# Patient Record
Sex: Female | Born: 1940
Health system: Southern US, Community
[De-identification: ages and names within clinical notes are randomized; demographics above are authoritative.]

## PROBLEM LIST (undated history)

## (undated) DIAGNOSIS — H3530 Unspecified macular degeneration: Secondary | ICD-10-CM

## (undated) DIAGNOSIS — D219 Benign neoplasm of connective and other soft tissue, unspecified: Secondary | ICD-10-CM

## (undated) DIAGNOSIS — H269 Unspecified cataract: Secondary | ICD-10-CM

## (undated) DIAGNOSIS — R569 Unspecified convulsions: Secondary | ICD-10-CM

## (undated) DIAGNOSIS — IMO0001 Reserved for inherently not codable concepts without codable children: Secondary | ICD-10-CM

## (undated) HISTORY — PX: BACK SURGERY: SHX140

## (undated) HISTORY — PX: INCONTINENCE SURGERY: SHX676

## (undated) HISTORY — PX: ABDOMINAL HYSTERECTOMY: SHX81

## (undated) HISTORY — DX: Benign neoplasm of connective and other soft tissue, unspecified: D21.9

## (undated) HISTORY — PX: APPENDECTOMY: SHX54

## (undated) HISTORY — PX: EYE SURGERY: SHX253

## (undated) HISTORY — DX: Unspecified cataract: H26.9

## (undated) HISTORY — DX: Reserved for inherently not codable concepts without codable children: IMO0001

## (undated) HISTORY — DX: Unspecified convulsions: R56.9

## (undated) HISTORY — PX: OTHER SURGICAL HISTORY: SHX169

---

## 1999-01-07 ENCOUNTER — Encounter: Payer: Self-pay | Admitting: Neurosurgery

## 1999-01-07 ENCOUNTER — Observation Stay (HOSPITAL_COMMUNITY): Admission: RE | Admit: 1999-01-07 | Discharge: 1999-01-07 | Payer: Self-pay | Admitting: Neurosurgery

## 2004-10-10 ENCOUNTER — Ambulatory Visit: Payer: Self-pay | Admitting: Unknown Physician Specialty

## 2004-10-28 ENCOUNTER — Ambulatory Visit: Payer: Self-pay | Admitting: Unknown Physician Specialty

## 2005-01-05 ENCOUNTER — Ambulatory Visit: Payer: Self-pay | Admitting: Gastroenterology

## 2007-11-01 ENCOUNTER — Encounter: Admission: RE | Admit: 2007-11-01 | Discharge: 2007-11-01 | Payer: Self-pay | Admitting: Family Medicine

## 2007-11-01 ENCOUNTER — Ambulatory Visit: Payer: Self-pay | Admitting: Family Medicine

## 2007-11-01 DIAGNOSIS — N318 Other neuromuscular dysfunction of bladder: Secondary | ICD-10-CM

## 2007-11-02 ENCOUNTER — Encounter: Payer: Self-pay | Admitting: Family Medicine

## 2007-11-02 DIAGNOSIS — M810 Age-related osteoporosis without current pathological fracture: Secondary | ICD-10-CM

## 2007-11-15 ENCOUNTER — Encounter: Payer: Self-pay | Admitting: Family Medicine

## 2007-11-23 ENCOUNTER — Ambulatory Visit: Payer: Self-pay | Admitting: Family Medicine

## 2007-11-23 ENCOUNTER — Encounter: Payer: Self-pay | Admitting: Family Medicine

## 2007-11-23 ENCOUNTER — Other Ambulatory Visit: Admission: RE | Admit: 2007-11-23 | Discharge: 2007-11-23 | Payer: Self-pay | Admitting: Family Medicine

## 2007-11-23 DIAGNOSIS — N814 Uterovaginal prolapse, unspecified: Secondary | ICD-10-CM | POA: Insufficient documentation

## 2007-11-24 ENCOUNTER — Encounter: Payer: Self-pay | Admitting: Family Medicine

## 2007-11-24 LAB — CONVERTED CEMR LAB: Vit D, 1,25-Dihydroxy: 16 — ABNORMAL LOW (ref 30–89)

## 2007-11-25 ENCOUNTER — Encounter: Payer: Self-pay | Admitting: Family Medicine

## 2007-11-25 LAB — CONVERTED CEMR LAB: Pap Smear: NORMAL

## 2007-12-26 ENCOUNTER — Encounter: Payer: Self-pay | Admitting: Family Medicine

## 2008-02-14 ENCOUNTER — Encounter: Admission: RE | Admit: 2008-02-14 | Discharge: 2008-02-14 | Payer: Self-pay | Admitting: Family Medicine

## 2008-02-14 ENCOUNTER — Ambulatory Visit: Payer: Self-pay | Admitting: Family Medicine

## 2008-02-14 DIAGNOSIS — IMO0002 Reserved for concepts with insufficient information to code with codable children: Secondary | ICD-10-CM

## 2008-02-16 ENCOUNTER — Encounter: Payer: Self-pay | Admitting: Family Medicine

## 2008-02-23 ENCOUNTER — Encounter: Payer: Self-pay | Admitting: Family Medicine

## 2008-02-24 ENCOUNTER — Encounter: Payer: Self-pay | Admitting: Family Medicine

## 2008-03-13 ENCOUNTER — Encounter: Payer: Self-pay | Admitting: Family Medicine

## 2008-04-17 ENCOUNTER — Encounter: Payer: Self-pay | Admitting: Family Medicine

## 2009-05-11 ENCOUNTER — Ambulatory Visit: Payer: Self-pay | Admitting: Family Medicine

## 2009-05-11 DIAGNOSIS — M5430 Sciatica, unspecified side: Secondary | ICD-10-CM

## 2009-05-22 ENCOUNTER — Ambulatory Visit: Payer: Self-pay | Admitting: Family Medicine

## 2009-06-03 ENCOUNTER — Telehealth: Payer: Self-pay | Admitting: Family Medicine

## 2009-06-10 ENCOUNTER — Encounter: Admission: RE | Admit: 2009-06-10 | Discharge: 2009-07-12 | Payer: Self-pay | Admitting: Family Medicine

## 2009-06-12 ENCOUNTER — Encounter: Payer: Self-pay | Admitting: Family Medicine

## 2009-06-26 ENCOUNTER — Encounter: Payer: Self-pay | Admitting: Family Medicine

## 2009-07-22 IMAGING — MG MM DIGITAL SCREENING
5 series · 5 of 5 positions shown · non-contrast
Comparison: none

DG SCREEN MAMMOGRAM BILATERAL
Bilateral CC and MLO view(s) were taken.
Technologist: Odai Hosein

DIGITAL SCREENING MAMMOGRAM WITH CAD:
There are scattered fibroglandular densities.  No masses or malignant type calcifications are 
identified.

[R CC]
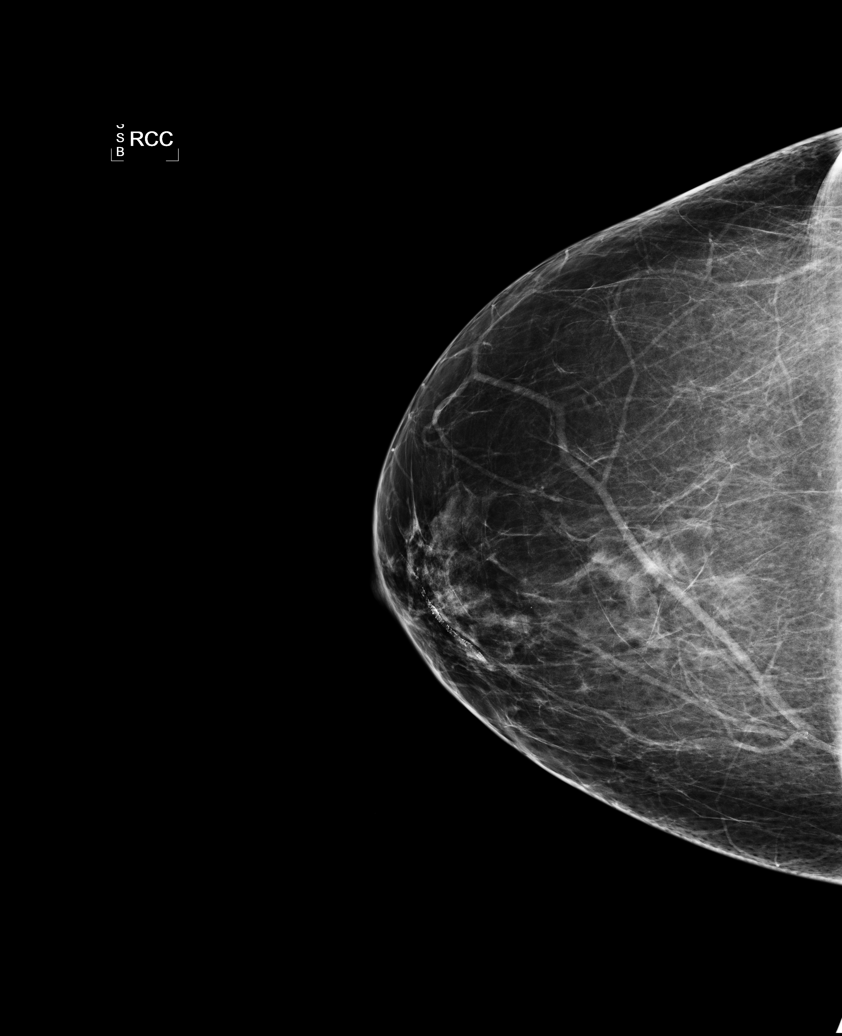

[L CC]
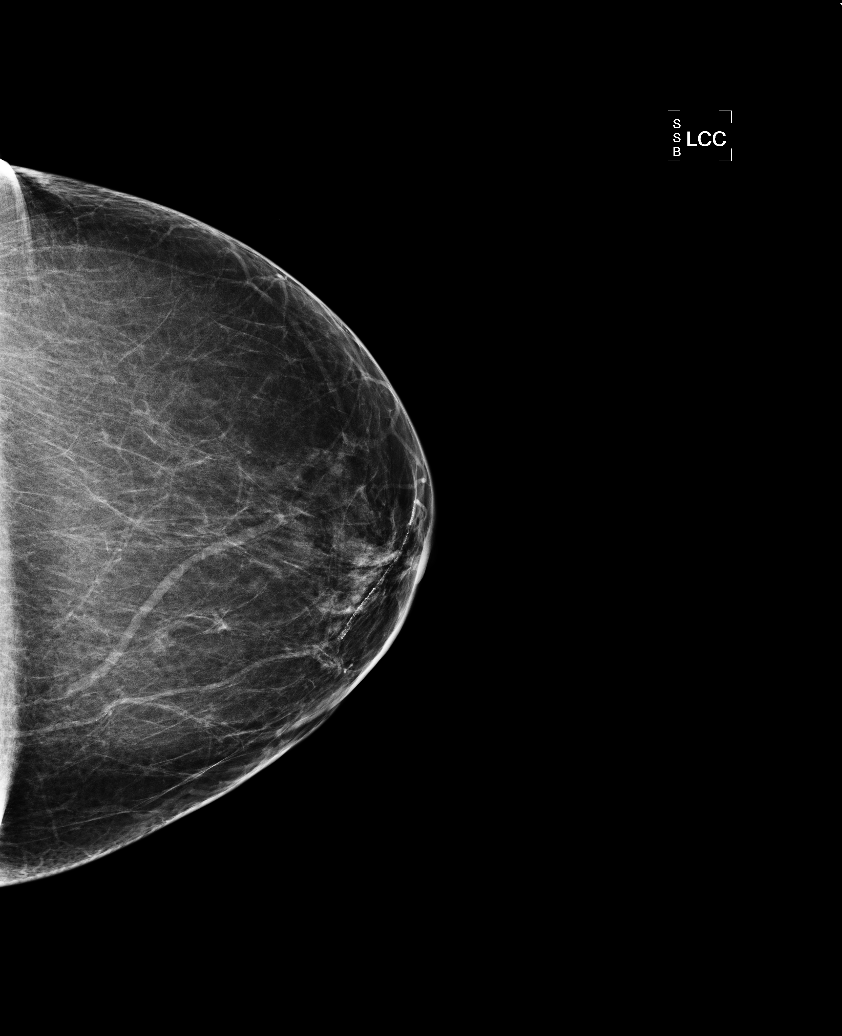

[L MLO]
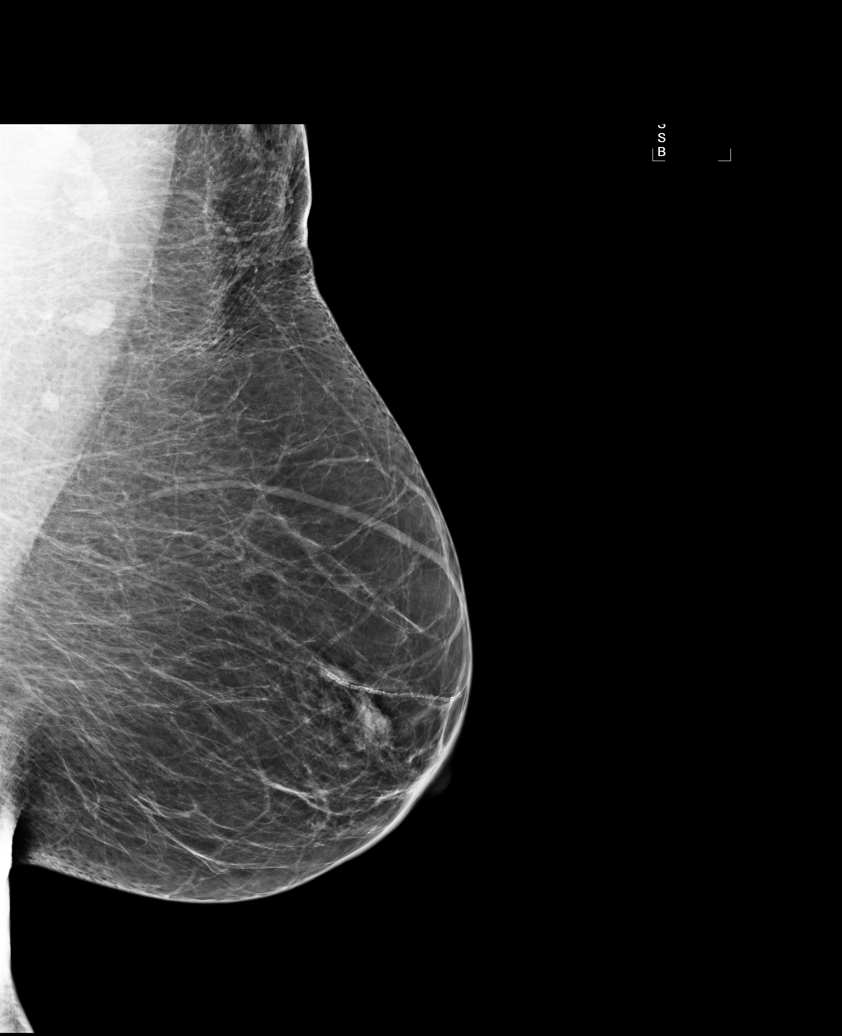

[R MLO (1 of 2)]
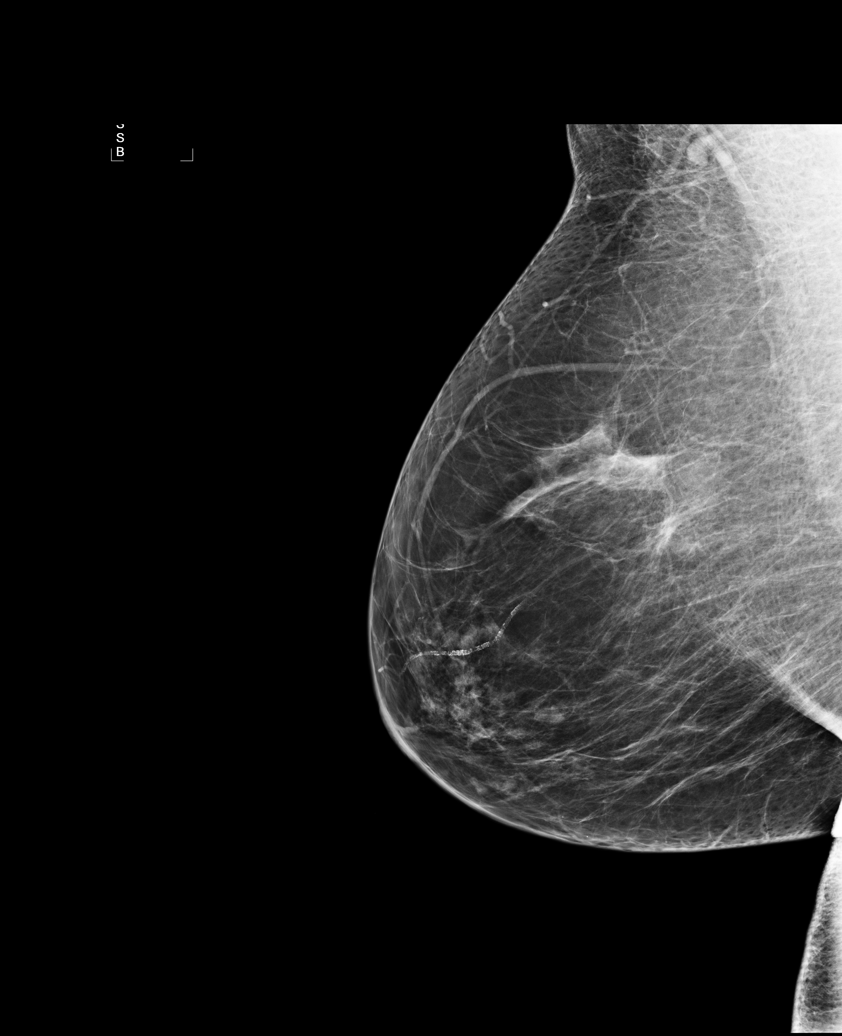

[R MLO (2 of 2)]
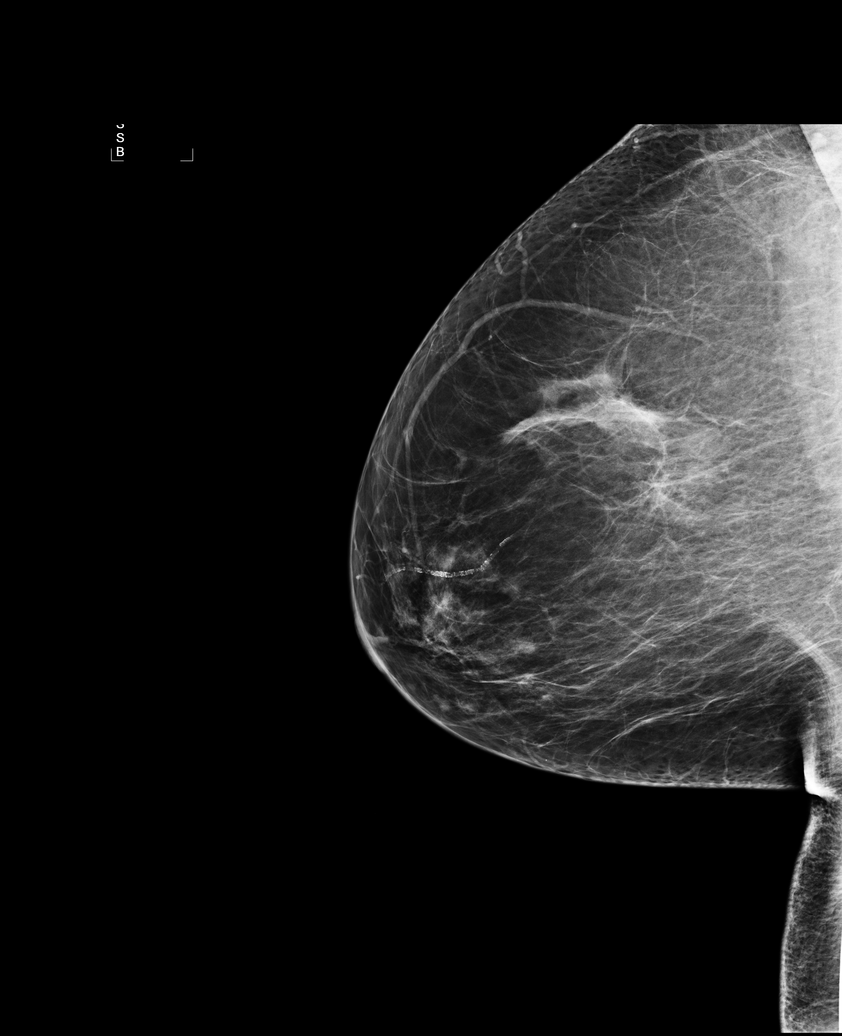

[5 of 5 positions shown; findings below may reference images not displayed]

IMPRESSION: No specific mammographic evidence of malignancy.  Next screening mammogram is recommended in one 
year.

ASSESSMENT: Negative - BI-RADS 1

Screening mammogram in 1 year.
THIS WAS ANALAYZED BY COMPUTER AIDED DETECTION. , THIS PROCEDURE WAS A DIGITAL MAMMOGRAM.

## 2010-07-23 ENCOUNTER — Ambulatory Visit: Payer: Self-pay | Admitting: Family Medicine

## 2010-07-23 ENCOUNTER — Encounter (INDEPENDENT_AMBULATORY_CARE_PROVIDER_SITE_OTHER): Payer: Self-pay | Admitting: *Deleted

## 2010-07-23 DIAGNOSIS — E559 Vitamin D deficiency, unspecified: Secondary | ICD-10-CM | POA: Insufficient documentation

## 2010-07-31 ENCOUNTER — Encounter: Payer: Self-pay | Admitting: Family Medicine

## 2010-08-01 DIAGNOSIS — E785 Hyperlipidemia, unspecified: Secondary | ICD-10-CM

## 2010-08-01 LAB — CONVERTED CEMR LAB
Albumin: 4.7 g/dL (ref 3.5–5.2)
BUN: 20 mg/dL (ref 6–23)
Calcium: 9.8 mg/dL (ref 8.4–10.5)
Chloride: 105 meq/L (ref 96–112)
Cholesterol: 237 mg/dL — ABNORMAL HIGH (ref 0–200)
Glucose, Bld: 96 mg/dL (ref 70–99)
HDL goal, serum: 40 mg/dL
HDL: 51 mg/dL (ref >39)
Hemoglobin: 14.9 g/dL (ref 12.0–15.0)
MCHC: 33.1 g/dL (ref 30.0–36.0)
Potassium: 5 meq/L (ref 3.5–5.3)
RBC: 4.63 M/uL (ref 3.87–5.11)
Triglycerides: 139 mg/dL (ref <150)

## 2010-08-13 ENCOUNTER — Encounter: Admission: RE | Admit: 2010-08-13 | Discharge: 2010-08-13 | Payer: Self-pay | Admitting: Family Medicine

## 2010-11-16 LAB — CONVERTED CEMR LAB
Albumin: 4.8 g/dL (ref 3.5–5.2)
Alkaline Phosphatase: 74 units/L (ref 39–117)
Chloride: 103 meq/L (ref 96–112)
Glucose, Bld: 86 mg/dL (ref 70–99)
LDL Cholesterol: 158 mg/dL — ABNORMAL HIGH (ref 0–99)
Potassium: 4.3 meq/L (ref 3.5–5.3)
Sodium: 142 meq/L (ref 135–145)
Total Protein: 7.8 g/dL (ref 6.0–8.3)
Triglycerides: 155 mg/dL — ABNORMAL HIGH (ref <150)

## 2010-11-18 NOTE — Assessment & Plan Note (Signed)
Summary: Medicare Annual Wellness Exam   Vital Signs:  Patient profile:   70 year old female Menstrual status:  postmenopausal Height:      63 inches Weight:      156 pounds BMI:     27.73 O2 Sat:      98 % on Room air Pulse rate:   85 / minute BP sitting:   134 / 84  (right arm)  Vitals Entered By: Payton Spark CMA (July 23, 2010 11:06 AM)  O2 Flow:  Room air CC: Medicare Wellness Exam     Menstrual Status postmenopausal Last PAP Result Normal   Primary Care Provider:  Seymour Bars DO  CC:  Medicare Wellness Exam.  History of Present Illness: see scanned in form.  Current Medications (verified): 1)  Centrum Silver   Tabs (Multiple Vitamins-Minerals) .... Take 1 Tablet By Mouth Once A Day 2)  Adult Aspirin Low Strength 81 Mg  Tbdp (Aspirin) .... Take 1 Tablet By Mouth Once A Day 3)  Citracal + D 250-200 Mg-Unit  Tabs (Calcium Citrate-Vitamin D) .... Take 1 Tablet By Mouth Two Times A Day 4)  Lutein 20 Mg Caps (Lutein)  Allergies (verified): No Known Drug Allergies  Past History:  Past Medical History: Reviewed history from 02/16/2008 and no changes required. uterine fibroids on u/s 2006 2D echo/ stress test "normal" 2006 CXR normal 2006 osteopenia ETT 1-06  normal colonoscopy 3-06; Dr Henrene Hawking  Past Surgical History: Reviewed history from 05/22/2009 and no changes required. Appendectomy diskectomy Pinnacle bladder sling/ trans obturator sling/ cystourethroscopy/ sacrospinalis ligament fixation of vaginal vault 02-2009  by Dr Charlsie Merles [facility]) hysterectomy  Family History: Reviewed history from 11/01/2007 and no changes required. brother alive, prostate cancer sister fibromyalgia 2 other sibblings healthy mother died pancreatic cancer father died 'old age'  Social History: Reviewed history from 11/01/2007 and no changes required. Retired Lawyer. Married to Victoria Vera.  Has 2 grown children.  Daughter and grandaughter in [place], son in [place]. Never  smoked. Denies ETOH. Walks 30 min a day. Good diet.  Review of Systems  The patient denies anorexia, fever, weight loss, weight gain, vision loss, decreased hearing, hoarseness, chest pain, syncope, dyspnea on exertion, peripheral edema, prolonged cough, headaches, hemoptysis, abdominal pain, melena, hematochezia, severe indigestion/heartburn, hematuria, incontinence, genital sores, muscle weakness, suspicious skin lesions, transient blindness, difficulty walking, depression, unusual weight change, abnormal bleeding, enlarged lymph nodes, angioedema, breast masses, and testicular masses.    Physical Exam  General:  alert, well-developed, well-nourished, well-hydrated, and overweight-appearing.   Head:  normocephalic and atraumatic.   Eyes:  pupils equal, pupils round, and pupils reactive to light.  wears glasses Ears:  EACs patent; TMs translucent and gray with good cone of light and bony landmarks.  Nose:  no nasal discharge.   Mouth:  pharynx pink and moist.  dentures in Neck:  no masses.  no audible carotid bruits Lungs:  normal respiratory effort, no intercostal retractions, no accessory muscle use, and normal breath sounds.   Heart:  normal rate, regular rhythm, and no murmur.   Abdomen:  Bowel sounds positive,abdomen soft and non-tender without masses, organomegaly or hernias noted. Msk:  synovitis in the DIP finger joints Pulses:  2+ radial and pedal pulses Extremities:  no E/C/C Skin:  color normal.   Psych:  good eye contact, not anxious appearing, and not depressed appearing.     Impression & Recommendations:  Problem # 1:  HEALTH SCREENING (ICD-V70.0) Schedule of personalized health plan reviewed and copy  given to pt today.   Orders given for fasting labs, mammogram and DEXA. Pneumovax done 09.  Declined flu shot. Pap due in 4 mos, will schedule.  Has eye exam tomorrow.   Colonoscopy due 2016. EKG done today showing NSR at 68 bpm, PR 154 ms, QTC 423 ms, no ST seg  changes or TW changes. Next Medicare AWV in 1 yr. Orders: [facility] -Subsequent Annual Wellness Visit [phone]) EKG w/ Interpretation (93000)  Complete Medication List: 1)  Centrum Silver Tabs (Multiple vitamins-minerals) .... Take 1 tablet by mouth once a day 2)  Adult Aspirin Low Strength 81 Mg Tbdp (Aspirin) .... Take 1 tablet by mouth once a day 3)  Citracal + D 250-200 Mg-unit Tabs (Calcium citrate-vitamin d) .... Take 1 tablet by mouth two times a day 4)  Lutein 20 Mg Caps (Lutein) 5)  Zostavax  .... Administer x 1 age >6  Other Orders: T-CBC No Diff ([id number]) T-Comprehensive Metabolic Panel [phone]) T-Lipid Profile [id number]) T-Vitamin D (25-Hydroxy) [id number]) T-Mammography Bilateral Screening (18299) T-DXA Bone Density/ Appendicular (37169) T-Dual DXA Bone Density/ Axial (67893)  Patient Instructions: 1)  Update labs, mammogram and DEXA. 2)  Order given for Zostavax.  Can bring to [facility]. 3)  Will call you w/ labs results tomorrow. 4)  Schedule PAP smear in 4 months. Prescriptions: ZOSTAVAX Administer x 1 age >10  #1 x 0   Entered and Authorized by:   Seymour Bars DO   Signed by:   Seymour Bars DO on 07/23/2010   Method used:   Print then Give to Patient   RxID:   [id number]

## 2010-11-18 NOTE — Miscellaneous (Signed)
Summary: Immunization Entry   Immunization History:  Zostavax History:    Zostavax # 1:  zostavax (07/23/2010)

## 2010-11-25 ENCOUNTER — Encounter: Payer: Self-pay | Admitting: Family Medicine

## 2010-11-25 ENCOUNTER — Other Ambulatory Visit: Payer: Self-pay | Admitting: Family Medicine

## 2010-11-25 ENCOUNTER — Ambulatory Visit (INDEPENDENT_AMBULATORY_CARE_PROVIDER_SITE_OTHER): Payer: MEDICARE | Admitting: Family Medicine

## 2010-11-25 ENCOUNTER — Other Ambulatory Visit (HOSPITAL_COMMUNITY)
Admission: RE | Admit: 2010-11-25 | Discharge: 2010-11-25 | Disposition: A | Discharge status: Home / Self Care | Payer: MEDICARE | Source: Ambulatory Visit | Attending: Family Medicine | Admitting: Family Medicine

## 2010-11-25 DIAGNOSIS — Z01419 Encounter for gynecological examination (general) (routine) without abnormal findings: Secondary | ICD-10-CM | POA: Insufficient documentation

## 2010-11-25 DIAGNOSIS — Z124 Encounter for screening for malignant neoplasm of cervix: Secondary | ICD-10-CM

## 2010-11-26 ENCOUNTER — Encounter: Payer: Self-pay | Admitting: Family Medicine

## 2010-12-04 NOTE — Assessment & Plan Note (Signed)
Summary: PAP   Vital Signs:  Patient profile:   70 year old female Menstrual status:  postmenopausal Height:      63 inches Weight:      157 pounds Pulse rate:   88 / minute BP sitting:   147 / 93  (right arm) Cuff size:   regular  Vitals Entered By: Avon Gully CMA, Duncan Dull) (November 25, 2010 8:34 AM) CC: PAP   Primary Care Provider:  Seymour Bars DO  CC:  PAP.  History of Present Illness: 70 yo WF presents for PAP only.  She has not had a pap in 3 yrs.  Married, monogamous.  Postmenopausal with no HRT use.  Denies vag bleeding or discharge but has some atrophy which causes painful intercourse even w/ lubricants.  No hx of abnormal pap.  O/W doing great.  BP at home today was 116/82.    Per records, she did have a hysterectomy.    Current Medications (verified): 1)  Centrum Silver   Tabs (Multiple Vitamins-Minerals) .... Take 1 Tablet By Mouth Once A Day 2)  Adult Aspirin Low Strength 81 Mg  Tbdp (Aspirin) .... Take 1 Tablet By Mouth Once A Day 3)  Citracal + D 250-200 Mg-Unit  Tabs (Calcium Citrate-Vitamin D) .... Take 1 Tablet By Mouth Two Times A Day 4)  Lutein 20 Mg Caps (Lutein) 5)  Zostavax .... Administer X 1 Age >60  Allergies (verified): No Known Drug Allergies  Comments:  Nurse/Medical Assistant: The patient's medications and allergies were reviewed with the patient and were updated in the Medication and Allergy Lists. Avon Gully CMA, Duncan Dull) (November 25, 2010 8:35 AM)  Past History:  Past Medical History: Reviewed history from 02/16/2008 and no changes required. uterine fibroids on u/s 2006 2D echo/ stress test "normal" 2006 CXR normal 2006 osteopenia ETT 1-06  normal colonoscopy 3-06; Dr Henrene Hawking  Past Surgical History: Reviewed history from 05/22/2009 and no changes required. Appendectomy diskectomy Pinnacle bladder sling/ trans obturator sling/ cystourethroscopy/ sacrospinalis ligament fixation of vaginal vault 02-2009  by Dr Charlsie Merles [facility]) hysterectomy  Social History: Reviewed history from 11/01/2007 and no changes required. 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  Daughter and grandaughter in [place] at [place], son in [place]. Never smoked. Denies ETOH. Walks 30 min a day. Good diet.  Review of Systems      See HPI  Physical Exam  General:  alert, well-developed, well-nourished, and well-hydrated.   Genitalia:  ext atrophy, normal vaginal mucosa.  vag cuff swabbed for thin prep pap   Impression & Recommendations:  Problem # 1:  SCREENING FOR MALIGNANT NEOPLASM OF THE CERVIX (ICD-V76.2) Pap done.  Low risk.  Given age and monogamous status, will not need any more paps after this unless having bleeding or discharged.  F/U results. RX for Estrace PV cream given to use 1 x a wk for atrophic vaginitis.  Complete Medication List: 1)  Centrum Silver Tabs (Multiple vitamins-minerals) .... Take 1 tablet by mouth once a day 2)  Adult Aspirin Low Strength 81 Mg Tbdp (Aspirin) .... Take 1 tablet by mouth once a day 3)  Citracal + D 250-200 Mg-unit Tabs (Calcium citrate-vitamin d) .... Take 1 tablet by mouth two times a day 4)  Lutein 20 Mg Caps (Lutein) 5)  Zostavax  .... Administer x 1 age >79 6)  Estrace 0.1 Mg/gm Crea (Estradiol) .Marland Kitchen.. 1 gram pv 1 x a wk  Patient Instructions: 1)  Will call you w/ pap  results in the next wk. 2)  Trial of Estrace vaginal cream 1 x a wk. 3)  Return for f/u in 6 mos. Prescriptions: ESTRACE 0.1 MG/GM CREA (ESTRADIOL) 1 gram PV 1 x a wk  #42.5 g x 2   Entered and Authorized by:   Seymour Bars DO   Signed by:   Seymour Bars DO on 11/25/2010   Method used:   Electronically to        [facility] [phone]* (retail)       [address]       [place], [place]  [address]       Ph: [phone]       Fax: [phone]   RxID:   [id number]    Orders Added: 1)  Est. Patient Level III [64403]

## 2011-04-12 ENCOUNTER — Encounter: Payer: Self-pay | Admitting: Emergency Medicine

## 2011-04-12 ENCOUNTER — Inpatient Hospital Stay (INDEPENDENT_AMBULATORY_CARE_PROVIDER_SITE_OTHER)
Admission: RE | Admit: 2011-04-12 | Discharge: 2011-04-12 | Disposition: A | Discharge status: Home / Self Care | Payer: Medicare Other | Source: Ambulatory Visit | Attending: Emergency Medicine | Admitting: Emergency Medicine

## 2011-04-12 DIAGNOSIS — N390 Urinary tract infection, site not specified: Secondary | ICD-10-CM

## 2011-04-15 ENCOUNTER — Telehealth (INDEPENDENT_AMBULATORY_CARE_PROVIDER_SITE_OTHER): Payer: Self-pay | Admitting: *Deleted

## 2011-06-02 ENCOUNTER — Encounter: Payer: Self-pay | Admitting: Family Medicine

## 2011-06-02 ENCOUNTER — Inpatient Hospital Stay (INDEPENDENT_AMBULATORY_CARE_PROVIDER_SITE_OTHER)
Admission: RE | Admit: 2011-06-02 | Discharge: 2011-06-02 | Disposition: A | Discharge status: Home / Self Care | Payer: Medicare Other | Source: Ambulatory Visit | Attending: Family Medicine | Admitting: Family Medicine

## 2011-06-02 DIAGNOSIS — N390 Urinary tract infection, site not specified: Secondary | ICD-10-CM

## 2011-06-02 LAB — CONVERTED CEMR LAB
Ketones, urine, test strip: NEGATIVE
Nitrite: POSITIVE
Urobilinogen, UA: 0.2

## 2011-06-06 ENCOUNTER — Telehealth (INDEPENDENT_AMBULATORY_CARE_PROVIDER_SITE_OTHER): Payer: Self-pay | Admitting: Emergency Medicine

## 2011-09-21 NOTE — Progress Notes (Signed)
Summary: Possible UTI? rm 4   Vital Signs:  Patient Profile:   70 Years Old Female CC:      ? UTI x 2 days Height:     63 inches Weight:      155 pounds O2 Sat:      98 % O2 treatment:    Room Air Temp:     98.5 degrees F oral Pulse rate:   76 / minute Resp:     18 per minute BP sitting:   122 / 72  (left arm) Cuff size:   regular  Vitals Entered By: Clemens Catholic LPN (June 02, 2011 10:22 AM)                  Prior Medication List:  CENTRUM SILVER   TABS (MULTIPLE VITAMINS-MINERALS) Take 1 tablet by mouth once a day ADULT ASPIRIN LOW STRENGTH 81 MG  TBDP (ASPIRIN) Take 1 tablet by mouth once a day CITRACAL + D 250-200 MG-UNIT  TABS (CALCIUM CITRATE-VITAMIN D) Take 1 tablet by mouth two times a day LUTEIN 20 MG CAPS (LUTEIN)  * ZOSTAVAX Administer x 1 age >57 ESTRACE 0.1 MG/GM CREA (ESTRADIOL) 1 gram PV 1 x a wk CIPROFLOXACIN HCL 250 MG TABS (CIPROFLOXACIN HCL) 1 by mouth two times a day for 7 days   Updated Prior Medication List: CENTRUM SILVER   TABS (MULTIPLE VITAMINS-MINERALS) Take 1 tablet by mouth once a day ADULT ASPIRIN LOW STRENGTH 81 MG  TBDP (ASPIRIN) Take 1 tablet by mouth once a day ESTRACE 0.1 MG/GM CREA (ESTRADIOL) 1 gram PV 1 x a wk  Current Allergies (reviewed today): No known allergies History of Present Illness Chief Complaint: ? UTI x 2 days History of Present Illness: Patient reports started having symptoms two days aago. This is her 2nd Uti in the last 6 months. She had back pain and lower abdominal pain   Current Problems: URINARY TRACT INFECTION (ICD-599.0) SCREENING FOR MALIGNANT NEOPLASM OF THE CERVIX (ICD-V76.2) HYPERLIPIDEMIA (ICD-272.4) HEALTH SCREENING (ICD-V70.0) OTHER SCREENING MAMMOGRAM (ICD-V76.12) OTH&UNSPEC ENDOCRN NUTRIT METAB&IMMUNITY D/O (ICD-V77.99) SCREENING FOR LIPOID DISORDERS (ICD-V77.91) SCREENING, IRON DEFICIENCY ANEMIA (ICD-V78.0) UNSPECIFIED VITAMIN D DEFICIENCY (ICD-268.9) SCIATICA (ICD-724.3) BACK PAIN,  LUMBAR, WITH RADICULOPATHY (ICD-724.4) UTERINE PROLAPSE (ICD-618.1) OSTEOPENIA (ICD-733.90) OVERACTIVE BLADDER (ICD-596.51)   Current Meds CENTRUM SILVER   TABS (MULTIPLE VITAMINS-MINERALS) Take 1 tablet by mouth once a day ADULT ASPIRIN LOW STRENGTH 81 MG  TBDP (ASPIRIN) Take 1 tablet by mouth once a day ESTRACE 0.1 MG/GM CREA (ESTRADIOL) 1 gram PV 1 x a wk PYRIDIUM 200 MG TABS (PHENAZOPYRIDINE HCL) 1 by mouth 3 x aday KEFLEX 500 MG CAPS (CEPHALEXIN) 1 by mouth twice a day  REVIEW OF SYSTEMS Constitutional Symptoms      Denies fever, chills, night sweats, weight loss, weight gain, and fatigue.  Eyes       Denies change in vision, eye pain, eye discharge, glasses, contact lenses, and eye surgery. Ear/Nose/Throat/Mouth       Denies hearing loss/aids, change in hearing, ear pain, ear discharge, dizziness, frequent runny nose, frequent nose bleeds, sinus problems, sore throat, hoarseness, and tooth pain or bleeding.  Respiratory       Denies dry cough, productive cough, wheezing, shortness of breath, asthma, bronchitis, and emphysema/COPD.  Cardiovascular       Denies murmurs, chest pain, and tires easily with exhertion.    Gastrointestinal       Denies stomach pain, nausea/vomiting, diarrhea, constipation, blood in bowel movements, and indigestion. Genitourniary  Complains of painful urination.      Denies kidney stones and loss of urinary control. Neurological       Denies paralysis, seizures, and fainting/blackouts. Musculoskeletal       Denies muscle pain, joint pain, joint stiffness, decreased range of motion, redness, swelling, muscle weakness, and gout.  Skin       Denies bruising, unusual mles/lumps or sores, and hair/skin or nail changes.  Psych       Denies mood changes, temper/anger issues, anxiety/stress, speech problems, depression, and sleep problems. Other Comments: pt c/o low back pain and burning with urination x 2 days. she took 2 AZO today.   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  Daughter and grandaughter in [place], son in [place]. Never smoked. Denies ETOH. Walks 30 min a day. Good diet.  Risk Factors: Caffeine Use: 2 (11/01/2007)  Risk Factors: Smoking Status: never (11/01/2007)  Past Medical History: Reviewed history from 02/16/2008 and no changes required. uterine fibroids on u/s 2006 2D echo/ stress test "normal" 2006 CXR normal 2006 osteopenia ETT 1-06  normal colonoscopy 3-06; Dr Henrene Hawking  Past Surgical History: Reviewed history from 05/22/2009 and no changes required. Appendectomy diskectomy Pinnacle bladder sling/ trans obturator sling/ cystourethroscopy/ sacrospinalis ligament fixation of vaginal vault 02-2009  by Dr Charlsie Merles [facility]) hysterectomy  Family History: Reviewed history from 11/01/2007 and no changes required. brother alive, prostate cancer sister fibromyalgia 2 other sibblings healthy mother died pancreatic cancer father died 'old age'  Social History: Reviewed history from 11/01/2007 and no changes required. 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  Daughter and grandaughter in [place], son in [place]. Never smoked. Denies ETOH. Walks 30 min a day. Good diet. Physical Exam General appearance: well developed, well nourished, no acute distress Head: normocephalic, atraumatic Neurological: grossly intact and non-focal Back: no CVA tenderness Skin: no obvious rashes or lesions MSE: oriented to time, place, and person Assessment Problems:   SCREENING FOR MALIGNANT NEOPLASM OF THE CERVIX (ICD-V76.2) HYPERLIPIDEMIA (ICD-272.4) HEALTH SCREENING (ICD-V70.0) OTHER SCREENING MAMMOGRAM (ICD-V76.12) OTH&UNSPEC ENDOCRN NUTRIT  METAB&IMMUNITY D/O (ICD-V77.99) SCREENING FOR LIPOID DISORDERS (ICD-V77.91) SCREENING, IRON DEFICIENCY ANEMIA (ICD-V78.0) UNSPECIFIED VITAMIN D DEFICIENCY (ICD-268.9) SCIATICA (ICD-724.3) BACK PAIN, LUMBAR, WITH RADICULOPATHY (ICD-724.4) UTERINE PROLAPSE (ICD-618.1) OSTEOPENIA (ICD-733.90) OVERACTIVE BLADDER (ICD-596.51) New Problems: URINARY TRACT INFECTION (ICD-599.0)   Patient Education: Patient and/or caregiver instructed in the following: rest fluids and Tylenol.  Plan New Medications/Changes: KEFLEX 500 MG CAPS (CEPHALEXIN) 1 by mouth twice a day  #20 x 0, 06/02/2011, Hassan Rowan MD PYRIDIUM 200 MG TABS (PHENAZOPYRIDINE HCL) 1 by mouth 3 x aday  #15 x 0, 06/02/2011, Hassan Rowan MD  New Orders: Est. Patient Level III [phone] Urinalysis-dipstick only (Medicare patient) [60454UJ] T-Culture, Urine [[id number]] Follow Up: Follow up in 2-3 days if no improvement, Follow up on an as needed basis, Follow up with Primary Physician  The patient and/or caregiver has been counseled thoroughly with regard to medications prescribed including dosage, schedule, interactions, rationale for use, and possible side effects and they verbalize understanding.  Diagnoses and expected course of recovery discussed and will return if not improved as expected or if the condition worsens. Patient and/or caregiver verbalized understanding.  Prescriptions: KEFLEX 500 MG CAPS (CEPHALEXIN) 1 by mouth twice a day  #20 x 0  Entered and Authorized by:   Hassan Rowan MD   Signed by:   Hassan Rowan MD on 06/02/2011   Method used:   Printed then faxed to ...       [facility] [phone]* (retail)       [address]       [place], [place]  [address]       Ph: [phone]       Fax: [phone]   RxID:   [id number] PYRIDIUM 200 MG TABS (PHENAZOPYRIDINE HCL) 1 by mouth 3 x aday  #15 x 0   Entered and Authorized by:   Hassan Rowan MD   Signed by:   Hassan Rowan MD on 06/02/2011   Method used:   Electronically  to        [facility] [phone]* (retail)       [address]       [place], [place]  [address]       Ph: [phone]       Fax: [phone]   RxID:   [id number]   Patient Instructions: 1)  Please schedule a follow-up appointment as needed. 2)  Please schedule an appointment with your primary doctor in :3-7 days 3)  Take your antibiotic as prescribed until ALL of it is gone, but stop if you develop a rash or swelling and contact our office as soon as possible. 4)  If another UTI occurs in the next 6 month please follow up w/your Urologist who did the sling procedure.  Orders Added: 1)  Est. Patient Level III [16010] 2)  Urinalysis-dipstick only (Medicare patient) [[id number]] 3)  T-Culture, Urine [[id number]]    Laboratory Results   Urine Tests  Date/Time Received: June 02, 2011 10:35 AM  Date/Time Reported: June 02, 2011 10:35 AM   Routine Urinalysis   Color: orange Appearance: Clear Glucose: trace   (Normal Range: Negative) Bilirubin: negative   (Normal Range: Negative) Ketone: negative   (Normal Range: Negative) Spec. Gravity: 1.005   (Normal Range: 1.003-1.035) Blood: trace-intact   (Normal Range: Negative) pH: 6.0   (Normal Range: 5.0-8.0) Protein: negative   (Normal Range: Negative) Urobilinogen: 0.2   (Normal Range: 0-1) Nitrite: positive   (Normal Range: Negative) Leukocyte Esterace: negative   (Normal Range: Negative)

## 2011-09-21 NOTE — Telephone Encounter (Signed)
  Phone Note Outgoing Call Call back at [facility] Phone [phone]   Call placed by: Lavell Islam RN,  June 06, 2011 4:06 PM Call placed to: Patient Summary of Call: Left message on voice mail: culture negative but continue ABX  for 3 full days and may then d/c, if desired. Encouraged her to call with questions/concerns. Initial call taken by: Lavell Islam RN,  June 06, 2011 4:07 PM

## 2011-09-21 NOTE — Progress Notes (Signed)
Summary: uti?/TM    Current Allergies: No known allergies History of Present Illness History from: patient History of Present Illness: 70 Years Old Female complains of UTI symptoms for a few days.  She describes the pain as burning during urination.  She has used Azo which has helped. + dysuria + frequency + urgency No hematuria No vaginal discharge + chills No lower abdomenal pain + R back pain No fatigue    Physical Exam General appearance: well developed, well nourished, no acute distress Abdomen: soft, non-tender without obvious organomegaly Back: no CVA or flank tenderness MSE: oriented to time, place, and person Assessment New Problems: URINARY TRACT INFECTION (ICD-599.0)   Plan New Medications/Changes: CIPROFLOXACIN HCL 250 MG TABS (CIPROFLOXACIN HCL) 1 by mouth two times a day for 7 days  #14 x 0, 04/12/2011, Hoyt Koch MD  New Orders: Est. Patient Level IV [phone] Services provided After hours-Weekends-Holidays [99051] T-Culture, Urine [[id number]] UA Dipstick w/o Micro (automated)  [81003] Planning Comments:   Increase hydration, continue the Azo for another day.  Take the Cipro as directed.  Urine culture is pending.  Follow-up with your primary care physician if not improving or if getting worse   The patient and/or caregiver has been counseled thoroughly with regard to medications prescribed including dosage, schedule, interactions, rationale for use, and possible side effects and they verbalize understanding.  Diagnoses and expected course of recovery discussed and will return if not improved as expected or if the condition worsens. Patient and/or caregiver verbalized understanding.  Prescriptions: CIPROFLOXACIN HCL 250 MG TABS (CIPROFLOXACIN HCL) 1 by mouth two times a day for 7 days  #14 x 0   Entered and Authorized by:   Hoyt Koch MD   Signed by:   Hoyt Koch MD on 04/12/2011   Method used:   Print then Give to Patient   RxID:    [id number]   Orders Added: 1)  Est. Patient Level IV [44010] 2)  Services provided After hours-Weekends-Holidays [99051] 3)  T-Culture, Urine [[id number]] 4)  UA Dipstick w/o Micro (automated)  [81003]  Appended Document: uti?/TM Urinalysis 04-12-11: Gluc=trace; Bil=+1; Ket=trace; SG=1.025; Blood- 3+;ph=5.5;Pro=3+;uro=2.0;Nit=+;Leu=3+;   orange(pyridium) turbid

## 2011-09-21 NOTE — Telephone Encounter (Signed)
  Phone Note Outgoing Call Call back at [facility] Phone [phone]   Call placed by: Lajean Saver RN,  April 15, 2011 6:21 PM Call placed to: Patient Summary of Call: Callback: Attempted callback multiple times. Receiving busy signal each time. No other number on record.

## 2012-07-14 ENCOUNTER — Ambulatory Visit (INDEPENDENT_AMBULATORY_CARE_PROVIDER_SITE_OTHER): Payer: Medicare Other | Admitting: Family Medicine

## 2012-07-14 ENCOUNTER — Encounter: Payer: Self-pay | Admitting: Family Medicine

## 2012-07-14 ENCOUNTER — Telehealth: Payer: Self-pay | Admitting: *Deleted

## 2012-07-14 VITALS — BP 141/89 | HR 87 | Wt 156.0 lb

## 2012-07-14 DIAGNOSIS — E559 Vitamin D deficiency, unspecified: Secondary | ICD-10-CM

## 2012-07-14 DIAGNOSIS — Z0000 Encounter for general adult medical examination without abnormal findings: Secondary | ICD-10-CM

## 2012-07-14 DIAGNOSIS — M8580 Other specified disorders of bone density and structure, unspecified site: Secondary | ICD-10-CM

## 2012-07-14 DIAGNOSIS — E785 Hyperlipidemia, unspecified: Secondary | ICD-10-CM

## 2012-07-14 DIAGNOSIS — Z1231 Encounter for screening mammogram for malignant neoplasm of breast: Secondary | ICD-10-CM

## 2012-07-14 DIAGNOSIS — Z23 Encounter for immunization: Secondary | ICD-10-CM

## 2012-07-14 DIAGNOSIS — Z9181 History of falling: Secondary | ICD-10-CM

## 2012-07-14 DIAGNOSIS — Z1331 Encounter for screening for depression: Secondary | ICD-10-CM

## 2012-07-14 LAB — COMPLETE METABOLIC PANEL WITH GFR
Albumin: 4.7 g/dL (ref 3.5–5.2)
BUN: 19 mg/dL (ref 6–23)
CO2: 28 mEq/L (ref 19–32)
Calcium: 10.1 mg/dL (ref 8.4–10.5)
Chloride: 101 mEq/L (ref 96–112)
GFR, Est African American: 84 mL/min
GFR, Est Non African American: 73 mL/min
Glucose, Bld: 85 mg/dL (ref 70–99)
Potassium: 4.4 mEq/L (ref 3.5–5.3)

## 2012-07-14 LAB — LIPID PANEL: Cholesterol: 227 mg/dL — ABNORMAL HIGH (ref 0–200)

## 2012-07-14 NOTE — Telephone Encounter (Signed)
Pt states that she did have a colonoscopy in March 2006 at [facility].

## 2012-07-14 NOTE — Patient Instructions (Addendum)
Start a regular exercise program and make sure you are eating a healthy diet Try to eat 4 servings of dairy a day or take a calcium supplement (500mg twice a day). Your vaccines are up to date.  We will call you with your lab results. If you don't here from us in about a week then please give us a call at [phone].  

## 2012-07-14 NOTE — Progress Notes (Signed)
Subjective:    Sherri Allison is a 71 y.o. female who presents for Medicare Annual/Subsequent preventive examination.  Preventive Screening-Counseling & Management  Tobacco History  Smoking status  . Never Smoker   Smokeless tobacco  . Not on file     Problems Prior to Visit 1.   Current Problems (verified) Patient Active Problem List  Diagnosis  . UNSPECIFIED VITAMIN D DEFICIENCY  . HYPERLIPIDEMIA  . OVERACTIVE BLADDER  . UTERINE PROLAPSE  . SCIATICA  . BACK PAIN, LUMBAR, WITH RADICULOPATHY  . OSTEOPENIA    Medications Prior to Visit No current outpatient prescriptions on file prior to visit.    Current Medications (verified) Current Outpatient Prescriptions  Medication Sig Dispense Refill  . aspirin 81 MG chewable tablet Chew 81 mg by mouth daily.      Marland Kitchen estradiol (ESTRACE) 0.1 MG/GM vaginal cream Place 2 g vaginally daily.      . Multiple Vitamins-Minerals (CENTRUM SILVER PO) Take 1 tablet by mouth every morning.      . multivitamin-lutein (OCUVITE-LUTEIN) CAPS Take 1 capsule by mouth daily.         Allergies (verified) Review of patient's allergies indicates no known allergies.   PAST HISTORY  Family History No family history on file.  Social History History  Substance Use Topics  . Smoking status: Never Smoker   . Smokeless tobacco: Not on file  . Alcohol Use: Not on file     Are there smokers in your home (other than you)? No  Risk Factors Current exercise habits: Home exercise routine includes eliptical for 15 -20 min 5 days per week.  Dietary issues discussed: healthy   Cardiac risk factors: advanced age (older than 28 for men, 74 for women).  Depression Screen (Note: if answer to either of the following is "Yes", a more complete depression screening is indicated)   Over the past two weeks, have you felt down, depressed or hopeless? No  Over the past two weeks, have you felt little interest or pleasure in doing things? No  Have you lost  interest or pleasure in daily life? No  Do you often feel hopeless? No  Do you cry easily over simple problems? No  Activities of Daily Living In your present state of health, do you have any difficulty performing the following activities?:  Driving? No Managing money?  No Feeding yourself? No Getting from bed to chair? No Climbing a flight of stairs? No Preparing food and eating?: No Bathing or showering? No Getting dressed: No Getting to the toilet? No Using the toilet:No Moving around from place to place: No In the past year have you fallen or had a near fall?:No   Are you sexually active?  No  Do you have more than one partner?  No  Hearing Difficulties: No Do you often ask people to speak up or repeat themselves? No Do you experience ringing or noises in your ears? No Do you have difficulty understanding soft or whispered voices? No   Do you feel that you have a problem with memory? No  Do you often misplace items? No  Do you feel safe at home?  No  Cognitive Testing  Alert? Yes  Normal Appearance?Yes  Oriented to person? Yes  Place? Yes   Time? Yes  Recall of three objects?  Yes  Can perform simple calculations? Yes  Displays appropriate judgment?Yes  Can read the correct time from a watch face?Yes   Advanced Directives have been discussed with the patient?  Yes  List the Names of Other Physician/Practitioners you currently use: 1.    Indicate any recent Medical Services you may have received from other than [facility] providers in the past year (date may be approximate).  Immunization History  Administered Date(s) Administered  . Pneumococcal Polysaccharide 11/23/2007  . Td 11/23/2007  . Zoster 07/23/2010    Screening Tests Health Maintenance  Topic Date Due  . Colonoscopy  10/19/2014  . Tetanus/tdap  11/22/2017  . Influenza Vaccine  06/19/2012  . Pneumococcal Polysaccharide Vaccine Age 60 And Over  Completed  . Zostavax  Completed    All answers were  reviewed with the patient and necessary referrals were made:  Bella Brummet, MD   07/14/2012   History reviewed: allergies, current medications, past family history, past medical history, past social history, past surgical history and problem list  Review of Systems A comprehensive review of systems was negative.    Objective:     Vision by Snellen chart: right eye:not measured. , left eye:Not measured  There is no height on file to calculate BMI. BP 141/89  Pulse 87  Wt 156 lb (70.761 kg)  BP 141/89  Pulse 87  Wt 156 lb (70.761 kg)  General Appearance:    Alert, cooperative, no distress, appears stated age  Head:    Normocephalic, without obvious abnormality, atraumatic  Eyes:    PERRL, conjunctiva/corneas clear, EOM's intact, both eyes  Ears:    Normal TM's and external ear canals, both ears  Nose:   Nares normal, septum midline, mucosa normal, no drainage    or sinus tenderness  Throat:   Lips, mucosa, and tongue normal; teeth and gums normal  Neck:   Supple, symmetrical, trachea midline, no adenopathy;    thyroid:  no enlargement/tenderness/nodules; no carotid   bruit or JVD  Back:     Symmetric, no curvature, ROM normal, no CVA tenderness  Lungs:     Clear to auscultation bilaterally, respirations unlabored  Chest Wall:    No tenderness or deformity   Heart:    Regular rate and rhythm, S1 and S2 normal, no murmur, rub   or gallop  Breast Exam:    No tenderness, masses, or nipple abnormality  Abdomen:     Soft, non-tender, bowel sounds active all four quadrants,    no masses, no organomegaly  Genitalia:    Not performed  Rectal:    Notperfromed  Extremities:   Extremities normal, atraumatic, no cyanosis or edema  Pulses:   2+ and symmetric all extremities  Skin:   Skin color, texture, turgor normal, no rashes or lesions  Lymph nodes:   Cervical, supraclavicular  nodes normal  Neurologic:   CNII-XII intact, normal strength, sensation and reflexes    throughout        Assessment:     Annual Wellness Exam     Plan:     During the course of the visit the patient was educated and counseled about appropriate screening and preventive services including:    Flu vaccine discussed.   Elevated BP today. She says her home blood pressures typically run around 120/80. She said that his blood pressure is unusual for her. She will continue keeping down at home.  Hyperlipidemia-due to recheck CMP and fasting lipid panel.  Due for bone density since she does have osteopenia. She'll be due in October.  Due for mammogram. We will schedule.  Diet review for nutrition referral? Yes ____  Not Indicated __x__   Patient Instructions (the  written plan) was given to the patient.  Medicare Attestation I have personally reviewed: The patient's medical and social history Their use of alcohol, tobacco or illicit drugs Their current medications and supplements The patient's functional ability including ADLs,fall risks, home safety risks, cognitive, and hearing and visual impairment Diet and physical activities Evidence for depression or mood disorders  The patient's weight, height, BMI, and visual acuity have been recorded in the chart.  I have made referrals, counseling, and provided education to the patient based on review of the above and I have provided the patient with a written personalized care plan for preventive services.     Micalah Cabezas, MD   07/14/2012

## 2012-07-14 NOTE — Telephone Encounter (Signed)
Updated health maintenance

## 2012-07-15 ENCOUNTER — Other Ambulatory Visit: Payer: Self-pay | Admitting: Family Medicine

## 2012-07-15 MED ORDER — PRAVASTATIN SODIUM 20 MG PO TABS: 20.0000 mg | ORAL_TABLET | Freq: Every day | ORAL | Status: DC

## 2012-08-30 ENCOUNTER — Ambulatory Visit (INDEPENDENT_AMBULATORY_CARE_PROVIDER_SITE_OTHER): Payer: Medicare Other

## 2012-08-30 DIAGNOSIS — Z1231 Encounter for screening mammogram for malignant neoplasm of breast: Secondary | ICD-10-CM

## 2012-08-30 DIAGNOSIS — M8580 Other specified disorders of bone density and structure, unspecified site: Secondary | ICD-10-CM

## 2012-08-30 DIAGNOSIS — M810 Age-related osteoporosis without current pathological fracture: Secondary | ICD-10-CM

## 2012-08-31 ENCOUNTER — Telehealth: Payer: Self-pay | Admitting: *Deleted

## 2012-08-31 ENCOUNTER — Encounter: Payer: Self-pay | Admitting: Family Medicine

## 2012-08-31 NOTE — Telephone Encounter (Signed)
Message copied by Florestine Avers on Wed Aug 31, 2012  2:10 PM ------      Message from: Nani Gasser D      Created: Wed Aug 31, 2012  2:07 PM       Call patient: Her bone density test shows that she does have osteoporosis. There is some worsening of bone density compared her report in 2011. I recommend a bisphosphonate which helps maintain the bones integrity. Then recheck bone density test in 2 years. If she's okay with me then in a prescription to the pharmacy then please let me know.

## 2012-08-31 NOTE — Telephone Encounter (Signed)
Patient aware and to repeat in one year

## 2012-08-31 NOTE — Telephone Encounter (Signed)
Patient aware of bone density and would like to try the rx per your request.

## 2012-09-01 ENCOUNTER — Telehealth: Payer: Self-pay

## 2012-09-01 NOTE — Telephone Encounter (Signed)
Really the bisphosphonates are the best medication on the market..They also have a metastatic because they have been around for so long. We could consider something such as Prolia,  which is an injection and not a bisphosphonate. Though she would need to check with her insurance to see if this is covered especially without having tried a bisphosphonate first.

## 2012-09-01 NOTE — Telephone Encounter (Signed)
Sherri Allison read about the bisphosphonate medication and is concerned about the side effects.  She wants to know if there is anything else she can take.

## 2012-09-02 MED ORDER — ALENDRONATE SODIUM 70 MG PO TABS: 70.0000 mg | ORAL_TABLET | ORAL | Status: DC

## 2012-09-02 NOTE — Telephone Encounter (Signed)
rx sent

## 2012-09-02 NOTE — Telephone Encounter (Signed)
Mckinsey agreed to take the bisphosphonate. She states it is ok to call in the prescription to [facility] in [place].

## 2013-07-03 ENCOUNTER — Other Ambulatory Visit: Payer: Self-pay | Admitting: Family Medicine

## 2013-08-01 ENCOUNTER — Other Ambulatory Visit: Payer: Self-pay | Admitting: Family Medicine

## 2013-09-01 ENCOUNTER — Other Ambulatory Visit: Payer: Self-pay | Admitting: Family Medicine

## 2013-09-21 ENCOUNTER — Other Ambulatory Visit: Payer: Self-pay | Admitting: Family Medicine

## 2013-09-21 ENCOUNTER — Ambulatory Visit (INDEPENDENT_AMBULATORY_CARE_PROVIDER_SITE_OTHER): Payer: Medicare Other | Admitting: Family Medicine

## 2013-09-21 ENCOUNTER — Encounter: Payer: Self-pay | Admitting: Family Medicine

## 2013-09-21 VITALS — BP 136/84 | HR 109 | Temp 98.4°F | Ht 63.0 in | Wt 158.0 lb

## 2013-09-21 DIAGNOSIS — E559 Vitamin D deficiency, unspecified: Secondary | ICD-10-CM

## 2013-09-21 DIAGNOSIS — Z1231 Encounter for screening mammogram for malignant neoplasm of breast: Secondary | ICD-10-CM

## 2013-09-21 DIAGNOSIS — E785 Hyperlipidemia, unspecified: Secondary | ICD-10-CM

## 2013-09-21 DIAGNOSIS — Z0000 Encounter for general adult medical examination without abnormal findings: Secondary | ICD-10-CM

## 2013-09-21 MED ORDER — ALENDRONATE SODIUM 70 MG PO TABS: ORAL_TABLET | ORAL | Status: DC

## 2013-09-21 NOTE — Patient Instructions (Signed)
Keep up a regular exercise program and make sure you are eating a healthy diet Try to eat 4 servings of dairy a day, or if you are lactose intolerant take a calcium with vitamin D daily.  Your vaccines are up to date.   

## 2013-09-21 NOTE — Progress Notes (Signed)
Subjective:    Sherri Allison is a 72 y.o. female who presents for Medicare Annual/Subsequent preventive examination.  Preventive Screening-Counseling & Management  Tobacco History  Smoking status  . Never Smoker   Smokeless tobacco  . Not on file     Problems Prior to Visit 1.   Current Problems (verified) Patient Active Problem List   Diagnosis Date Noted  . HYPERLIPIDEMIA 08/01/2010  . UNSPECIFIED VITAMIN D DEFICIENCY 07/23/2010  . SCIATICA 05/11/2009  . BACK PAIN, LUMBAR, WITH RADICULOPATHY 02/14/2008  . UTERINE PROLAPSE 11/23/2007  . Osteoporosis 11/02/2007  . OVERACTIVE BLADDER 11/01/2007    Medications Prior to Visit Current Outpatient Prescriptions on File Prior to Visit  Medication Sig Dispense Refill  . alendronate (FOSAMAX) 70 MG tablet TAKE 1 TABLET BY MOUTH EVERY 7 DAYS. TAKE WITH A FULL GLASS OF WATER ON AN EMPTY STOMACH.  4 tablet  0  . aspirin 81 MG chewable tablet Chew 81 mg by mouth daily.      . Multiple Vitamins-Minerals (CENTRUM SILVER PO) Take 1 tablet by mouth every morning.      . multivitamin-lutein (OCUVITE-LUTEIN) CAPS Take 1 capsule by mouth daily.       No current facility-administered medications on file prior to visit.    Current Medications (verified) Current Outpatient Prescriptions  Medication Sig Dispense Refill  . alendronate (FOSAMAX) 70 MG tablet TAKE 1 TABLET BY MOUTH EVERY 7 DAYS. TAKE WITH A FULL GLASS OF WATER ON AN EMPTY STOMACH.  4 tablet  0  . aspirin 81 MG chewable tablet Chew 81 mg by mouth daily.      . Multiple Vitamins-Minerals (CENTRUM SILVER PO) Take 1 tablet by mouth every morning.      . multivitamin-lutein (OCUVITE-LUTEIN) CAPS Take 1 capsule by mouth daily.       No current facility-administered medications for this visit.     Allergies (verified) Review of patient's allergies indicates no known allergies.   PAST HISTORY  Family History Family History  Problem Relation Age of Onset  . Prostate cancer  Brother   . Fibromyalgia Sister   . Pancreatic cancer Mother     Social History History  Substance Use Topics  . Smoking status: Never Smoker   . Smokeless tobacco: Not on file  . Alcohol Use: No     Are there smokers in your home (other than you)? No  Risk Factors Current exercise habits: walk, eliptical daily  Dietary issues discussed: None   Cardiac risk factors: advanced age (older than 6 for men, 39 for women).  Depression Screen (Note: if answer to either of the following is "Yes", a more complete depression screening is indicated)   Over the past two weeks, have you felt down, depressed or hopeless? No  Over the past two weeks, have you felt little interest or pleasure in doing things? No  Have you lost interest or pleasure in daily life? No  Do you often feel hopeless? No  Do you cry easily over simple problems? No  Activities of Daily Living In your present state of health, do you have any difficulty performing the following activities?:  Driving? No Managing money?  No Feeding yourself? No Getting from bed to chair? No Climbing a flight of stairs? No Preparing food and eating?: No Bathing or showering? No Getting dressed: No Getting to the toilet? No Using the toilet:No Moving around from place to place: No In the past year have you fallen or had a near fall?:No  Are you sexually active?  Yes  Do you have more than one partner?  No  Hearing Difficulties: No Do you often ask people to speak up or repeat themselves? No Do you experience ringing or noises in your ears? No Do you have difficulty understanding soft or whispered voices? No   Do you feel that you have a problem with memory? No  Do you often misplace items? No  Do you feel safe at home?  No  Cognitive Testing  Alert? Yes  Normal Appearance?Yes  Oriented to person? Yes  Place? Yes   Time? Yes  Recall of three objects?  Yes  Can perform simple calculations? Yes  Displays appropriate  judgment?Yes  Can read the correct time from a watch face?Yes   Advanced Directives have been discussed with the patient? Yes  List the Names of Other Physician/Practitioners you currently use: 1.    Indicate any recent Medical Services you may have received from other than [facility] providers in the past year (date may be approximate).  Immunization History  Administered Date(s) Administered  . Pneumococcal Polysaccharide-23 11/23/2007  . Td 11/23/2007  . Zoster 07/23/2010    Screening Tests Health Maintenance  Topic Date Due  . Influenza Vaccine  05/19/2013  . Colonoscopy  12/18/2014  . Tetanus/tdap  11/22/2017  . Pneumococcal Polysaccharide Vaccine Age 14 And Over  Completed  . Zostavax  Completed    All answers were reviewed with the patient and necessary referrals were made:  Markea Ruzich, MD   09/21/2013   History reviewed: allergies, current medications, past family history, past medical history, past social history, past surgical history and problem list  Review of Systems A comprehensive review of systems was negative.    Objective:     Vision by Snellen chart: Had eye exam at [facility].   Body mass index is 28 kg/(m^2). BP 136/84  Pulse 109  Temp(Src) 98.4 F (36.9 C) (Oral)  Ht 5\' 3"  (1.6 m)  Wt 158 lb (71.668 kg)  BMI 28.00 kg/m2  BP 136/84  Pulse 109  Temp(Src) 98.4 F (36.9 C) (Oral)  Ht 5\' 3"  (1.6 m)  Wt 158 lb (71.668 kg)  BMI 28.00 kg/m2  General Appearance:    Alert, cooperative, no distress, appears stated age  Head:    Normocephalic, without obvious abnormality, atraumatic  Eyes:    PERRL, conjunctiva/corneas clear, EOM's intact, both eyes  Ears:    Normal TM's and external ear canals, both ears  Nose:   Nares normal, septum midline, mucosa normal, no drainage    or sinus tenderness  Throat:   Lips, mucosa, and tongue normal; teeth and gums normal  Neck:   Supple, symmetrical, trachea midline, no adenopathy;    thyroid:  no  enlargement/tenderness/nodules; no carotid   bruit or JVD  Back:     Symmetric, no curvature, ROM normal, no CVA tenderness  Lungs:     Clear to auscultation bilaterally, respirations unlabored  Chest Wall:    No tenderness or deformity   Heart:    Regular rate and rhythm, S1 and S2 normal, no murmur, rub   or gallop  Breast Exam:    No tenderness, masses, or nipple abnormality  Abdomen:     Soft, non-tender, bowel sounds active all four quadrants,    no masses, no organomegaly  Genitalia:    Not performe.d   Rectal:    Not performed.   Extremities:   Extremities normal, atraumatic, no cyanosis or edema  Pulses:  2+ and symmetric all extremities  Skin:   Skin color, texture, turgor normal, no rashes or lesions  Lymph nodes:   Cervical, supraclavicular, and axillary nodes normal  Neurologic:   CNII-XII intact, gait and station is normal,  reflexes    throughout       Assessment:     Annual medicare exam.       Plan:     During the course of the visit the patient was educated and counseled about appropriate screening and preventive services including:    decline flu vaccine. Says uses homeopathic meds  Hyperlipidemia - she took statin for 3 mo last year and then d/c it. She says she tolerated it well. Prefers homeopathic remedies.   F/u in one year.    Diet review for nutrition referral? Yes ____  Not Indicated __X_   Patient Instructions (the written plan) was given to the patient.  Medicare Attestation I have personally reviewed: The patient's medical and social history Their use of alcohol, tobacco or illicit drugs Their current medications and supplements The patient's functional ability including ADLs,fall risks, home safety risks, cognitive, and hearing and visual impairment Diet and physical activities Evidence for depression or mood disorders  The patient's weight, height, BMI, and visual acuity have been recorded in the chart.  I have made referrals,  counseling, and provided education to the patient based on review of the above and I have provided the patient with a written personalized care plan for preventive services.     Tayshaun Kroh, MD   09/21/2013

## 2013-09-26 ENCOUNTER — Ambulatory Visit (INDEPENDENT_AMBULATORY_CARE_PROVIDER_SITE_OTHER): Payer: Medicare Other

## 2013-09-26 DIAGNOSIS — Z1231 Encounter for screening mammogram for malignant neoplasm of breast: Secondary | ICD-10-CM

## 2013-09-26 LAB — COMPLETE METABOLIC PANEL WITH GFR
ALT: <8 U/L (ref 0–35)
AST: 16 U/L (ref 0–37)
Alkaline Phosphatase: 55 U/L (ref 39–117)
BUN: 20 mg/dL (ref 6–23)
Calcium: 10.2 mg/dL (ref 8.4–10.5)
Chloride: 102 mEq/L (ref 96–112)
Creat: 0.93 mg/dL (ref 0.50–1.10)
Total Bilirubin: 1.7 mg/dL — ABNORMAL HIGH (ref 0.3–1.2)

## 2013-09-26 LAB — LIPID PANEL
Cholesterol: 246 mg/dL — ABNORMAL HIGH (ref 0–200)
HDL: 54 mg/dL (ref >39)
Triglycerides: 154 mg/dL — ABNORMAL HIGH (ref <150)
VLDL: 31 mg/dL (ref 0–40)

## 2013-09-27 ENCOUNTER — Encounter: Payer: Self-pay | Admitting: Family Medicine

## 2014-08-03 ENCOUNTER — Other Ambulatory Visit: Payer: Self-pay

## 2014-10-22 ENCOUNTER — Encounter: Payer: Self-pay | Admitting: Family Medicine

## 2014-10-22 ENCOUNTER — Ambulatory Visit (INDEPENDENT_AMBULATORY_CARE_PROVIDER_SITE_OTHER): Payer: Medicare Other | Admitting: Family Medicine

## 2014-10-22 VITALS — BP 133/82 | HR 83 | Ht 63.0 in | Wt 157.0 lb

## 2014-10-22 DIAGNOSIS — E785 Hyperlipidemia, unspecified: Secondary | ICD-10-CM | POA: Diagnosis not present

## 2014-10-22 DIAGNOSIS — Z0000 Encounter for general adult medical examination without abnormal findings: Secondary | ICD-10-CM | POA: Diagnosis not present

## 2014-10-22 DIAGNOSIS — Z1231 Encounter for screening mammogram for malignant neoplasm of breast: Secondary | ICD-10-CM | POA: Diagnosis not present

## 2014-10-22 DIAGNOSIS — M810 Age-related osteoporosis without current pathological fracture: Secondary | ICD-10-CM

## 2014-10-22 LAB — COMPLETE METABOLIC PANEL WITH GFR
ALBUMIN: 4.9 g/dL (ref 3.5–5.2)
ALK PHOS: 54 U/L (ref 39–117)
ALT: 8 U/L (ref 0–35)
AST: 19 U/L (ref 0–37)
BUN: 18 mg/dL (ref 6–23)
CO2: 27 mEq/L (ref 19–32)
Calcium: 10.2 mg/dL (ref 8.4–10.5)
Chloride: 101 mEq/L (ref 96–112)
Creat: 1.05 mg/dL (ref 0.50–1.10)
GFR, EST AFRICAN AMERICAN: 61 mL/min
GFR, EST NON AFRICAN AMERICAN: 53 mL/min — AB
GLUCOSE: 100 mg/dL — AB (ref 70–99)
Potassium: 4.9 mEq/L (ref 3.5–5.3)
Sodium: 139 mEq/L (ref 135–145)
Total Bilirubin: 1.5 mg/dL — ABNORMAL HIGH (ref 0.2–1.2)
Total Protein: 7.5 g/dL (ref 6.0–8.3)

## 2014-10-22 LAB — LIPID PANEL
Cholesterol: 241 mg/dL — ABNORMAL HIGH (ref 0–200)
HDL: 56 mg/dL (ref >39)
LDL Cholesterol: 153 mg/dL — ABNORMAL HIGH (ref 0–99)
TRIGLYCERIDES: 158 mg/dL — AB (ref <150)
Total CHOL/HDL Ratio: 4.3 Ratio
VLDL: 32 mg/dL (ref 0–40)

## 2014-10-22 NOTE — Patient Instructions (Signed)
Keep up a regular exercise program and make sure you are eating a healthy diet Try to eat 4 servings of dairy a day, or if you are lactose intolerant take a calcium with vitamin D daily.  Your vaccines are up to date.   

## 2014-10-22 NOTE — Progress Notes (Signed)
Subjective:    Sherri Allison is a 74 y.o. female who presents for Medicare Annual/Subsequent preventive examination.  Preventive Screening-Counseling & Management  Tobacco History  Smoking status  . Never Smoker   Smokeless tobacco  . Not on file     Problems Prior to Visit 1.   Current Problems (verified) Patient Active Problem List   Diagnosis Date Noted  . HYPERLIPIDEMIA 08/01/2010  . UNSPECIFIED VITAMIN D DEFICIENCY 07/23/2010  . SCIATICA 05/11/2009  . BACK PAIN, LUMBAR, WITH RADICULOPATHY 02/14/2008  . UTERINE PROLAPSE 11/23/2007  . Osteoporosis 11/02/2007  . OVERACTIVE BLADDER 11/01/2007    Medications Prior to Visit Current Outpatient Prescriptions on File Prior to Visit  Medication Sig Dispense Refill  . alendronate (FOSAMAX) 70 MG tablet TAKE 1 TABLET BY MOUTH EVERY 7 DAYS. TAKE WITH A FULL GLASS OF WATER ON AN EMPTY STOMACH. 4 tablet 11  . aspirin 81 MG chewable tablet Chew 81 mg by mouth daily.    . Multiple Vitamins-Minerals (CENTRUM SILVER PO) Take 1 tablet by mouth every morning.    . multivitamin-lutein (OCUVITE-LUTEIN) CAPS Take 1 capsule by mouth daily.     No current facility-administered medications on file prior to visit.    Current Medications (verified) Current Outpatient Prescriptions  Medication Sig Dispense Refill  . alendronate (FOSAMAX) 70 MG tablet TAKE 1 TABLET BY MOUTH EVERY 7 DAYS. TAKE WITH A FULL GLASS OF WATER ON AN EMPTY STOMACH. 4 tablet 11  . aspirin 81 MG chewable tablet Chew 81 mg by mouth daily.    . Multiple Vitamins-Minerals (CENTRUM SILVER PO) Take 1 tablet by mouth every morning.    . multivitamin-lutein (OCUVITE-LUTEIN) CAPS Take 1 capsule by mouth daily.     No current facility-administered medications for this visit.     Allergies (verified) Review of patient's allergies indicates no known allergies.   PAST HISTORY  Family History Family History  Problem Relation Age of Onset  . Prostate cancer Brother   .  Fibromyalgia Sister   . Pancreatic cancer Mother     Social History History  Substance Use Topics  . Smoking status: Never Smoker   . Smokeless tobacco: Not on file  . Alcohol Use: No     Are there smokers in your home (other than you)? No  Risk Factors Current exercise habits: walking and exercise machine.   Dietary issues discussed: none   Cardiac risk factors: advanced age (older than 19 for men, 46 for women) and dyslipidemia.  Depression Screen (Note: if answer to either of the following is "Yes", a more complete depression screening is indicated)   Over the past two weeks, have you felt down, depressed or hopeless? No  Over the past two weeks, have you felt little interest or pleasure in doing things? No  Have you lost interest or pleasure in daily life? No  Do you often feel hopeless? No  Do you cry easily over simple problems? No  Activities of Daily Living In your present state of health, do you have any difficulty performing the following activities?:  Driving? No Managing money?  No Feeding yourself? No Getting from bed to chair? No  Climbing a flight of stairs? No Preparing food and eating?: No Bathing or showering? No Getting dressed: No Getting to the toilet? No Using the toilet:No Moving around from place to place: No In the past year have you fallen or had a near fall?:No   Are you sexually active?  No  Do you have more  than one partner?  No  Hearing Difficulties: No Do you often ask people to speak up or repeat themselves? No Do you experience ringing or noises in your ears? No Do you have difficulty understanding soft or whispered voices? No   Do you feel that you have a problem with memory? No  Do you often misplace items? No  Do you feel safe at home?  Yes  Cognitive Testing  Alert? Yes  Normal Appearance?Yes  Oriented to person? Yes  Place? Yes   Time? Yes  Recall of three objects?  No  Can perform simple calculations? Yes  Displays  appropriate judgment?Yes  Can read the correct time from a watch face?Yes    6 CIT score of 10/28 9 significant)    Advanced Directives have been discussed with the patient? No  List the Names of Other Physician/Practitioners you currently use: 1.  Eye doc  Indicate any recent Medical Services you may have received from other than [facility] providers in the past year (date may be approximate).  Immunization History  Administered Date(s) Administered  . Pneumococcal Polysaccharide-23 11/23/2007  . Td 11/23/2007  . Zoster 07/23/2010    Screening Tests Health Maintenance  Topic Date Due  . INFLUENZA VACCINE  05/19/2014  . COLONOSCOPY  12/18/2014  . MAMMOGRAM  09/27/2015  . TETANUS/TDAP  11/22/2017  . DEXA SCAN  Completed  . PNEUMOCOCCAL POLYSACCHARIDE VACCINE AGE 69 AND OVER  Completed  . ZOSTAVAX  Completed    All answers were reviewed with the patient and necessary referrals were made:  METHENEY,CATHERINE, MD   10/22/2014   History reviewed: allergies, current medications, past family history, past medical history, past social history, past surgical history and problem list  Review of Systems A comprehensive review of systems was negative.    Objective:     Vision by Snellen chart: eye exam is schedule for Feb  Body mass index is 27.82 kg/(m^2). BP 133/82 mmHg  Pulse 83  Ht 5\' 3"  (1.6 m)  Wt 157 lb (71.215 kg)  BMI 27.82 kg/m2  SpO2 97%  BP 133/82 mmHg  Pulse 83  Ht 5\' 3"  (1.6 m)  Wt 157 lb (71.215 kg)  BMI 27.82 kg/m2  SpO2 97%  General Appearance:    Alert, cooperative, no distress, appears stated age  Head:    Normocephalic, without obvious abnormality, atraumatic  Eyes:    PERRL, conjunctiva/corneas clear, EOM's intact, both eyes  Ears:    Normal TM's and external ear canals, both ears  Nose:   Nares normal, septum midline, mucosa normal, no drainage    or sinus tenderness  Throat:   Lips, mucosa, and tongue normal; teeth and gums normal  Neck:   Supple,  symmetrical, trachea midline, no adenopathy;    thyroid:  no enlargement/tenderness/nodules; no carotid   bruit or JVD  Back:     Symmetric, no curvature, ROM normal, no CVA tenderness  Lungs:     Clear to auscultation bilaterally, respirations unlabored  Chest Wall:    No tenderness or deformity   Heart:    Regular rate and rhythm, S1 and S2 normal, no murmur, rub   or gallop  Breast Exam:    No tenderness, masses, or nipple abnormality  Abdomen:     Soft, non-tender, bowel sounds active all four quadrants,    no masses, no organomegaly  Genitalia:    Not performed   Rectal:    Not performed.   Extremities:   Extremities normal, atraumatic, no  cyanosis or edema  Pulses:   2+ and symmetric all extremities  Skin:   Skin color, texture, turgor normal, no rashes or lesions  Lymph nodes:   Cervical, supraclavicular, and axillary nodes normal  Neurologic:   CNII-XII intact, normal strength, sensation and reflexes    throughout       Assessment:     Annual wellnes exam       Plan:     During the course of the visit the patient was educated and counseled about appropriate screening and preventive services including:   Influenza vaccine  - she declined Eye exam schedule next month.  Due for bone density test.   No advanced directives. Given brochure today.  Due for CMP and lipid.  Due for mammogram.   Diet review for nutrition referral? Yes ____  Not Indicated _X__   Patient Instructions (the written plan) was given to the patient.  Medicare Attestation I have personally reviewed: The patient's medical and social history Their use of alcohol, tobacco or illicit drugs Their current medications and supplements The patient's functional ability including ADLs,fall risks, home safety risks, cognitive, and hearing and visual impairment Diet and physical activities Evidence for depression or mood disorders  The patient's weight, height, BMI, and visual acuity have been recorded  in the chart.  I have made referrals, counseling, and provided education to the patient based on review of the above and I have provided the patient with a written personalized care plan for preventive services.     METHENEY,CATHERINE, MD   10/22/2014

## 2014-10-23 LAB — VITAMIN D 25 HYDROXY (VIT D DEFICIENCY, FRACTURES): Vit D, 25-Hydroxy: 35 ng/mL (ref 30–100)

## 2014-10-31 ENCOUNTER — Ambulatory Visit (INDEPENDENT_AMBULATORY_CARE_PROVIDER_SITE_OTHER): Payer: Medicare Other

## 2014-10-31 DIAGNOSIS — Z1231 Encounter for screening mammogram for malignant neoplasm of breast: Secondary | ICD-10-CM | POA: Diagnosis not present

## 2014-10-31 DIAGNOSIS — M810 Age-related osteoporosis without current pathological fracture: Secondary | ICD-10-CM

## 2014-10-31 DIAGNOSIS — M859 Disorder of bone density and structure, unspecified: Secondary | ICD-10-CM

## 2014-12-24 DIAGNOSIS — H2513 Age-related nuclear cataract, bilateral: Secondary | ICD-10-CM | POA: Diagnosis not present

## 2015-04-15 ENCOUNTER — Other Ambulatory Visit: Payer: Self-pay

## 2015-06-26 ENCOUNTER — Ambulatory Visit (INDEPENDENT_AMBULATORY_CARE_PROVIDER_SITE_OTHER): Payer: Medicare Other | Admitting: Family Medicine

## 2015-06-26 VITALS — Temp 98.1°F

## 2015-06-26 DIAGNOSIS — Z23 Encounter for immunization: Secondary | ICD-10-CM | POA: Diagnosis not present

## 2015-06-26 NOTE — Progress Notes (Signed)
   Subjective:    Patient ID: Sherri Allison, female    DOB: [birth year], 74 y.o.   MRN: [id number]  HPI  [name] is here for a Prevnar injection.    Review of Systems     Objective:   Physical Exam        Assessment & Plan:  Patient tolerated injection well without complications.

## 2016-01-07 LAB — IFOBT (OCCULT BLOOD): IFOBT: NEGATIVE

## 2016-01-14 ENCOUNTER — Ambulatory Visit (INDEPENDENT_AMBULATORY_CARE_PROVIDER_SITE_OTHER): Payer: Medicare Other | Admitting: Family Medicine

## 2016-01-14 ENCOUNTER — Encounter: Payer: Self-pay | Admitting: Family Medicine

## 2016-01-14 VITALS — BP 138/81 | HR 82 | Wt 156.0 lb

## 2016-01-14 DIAGNOSIS — Z1231 Encounter for screening mammogram for malignant neoplasm of breast: Secondary | ICD-10-CM

## 2016-01-14 DIAGNOSIS — Z0000 Encounter for general adult medical examination without abnormal findings: Secondary | ICD-10-CM | POA: Diagnosis not present

## 2016-01-14 DIAGNOSIS — E785 Hyperlipidemia, unspecified: Secondary | ICD-10-CM | POA: Diagnosis not present

## 2016-01-14 DIAGNOSIS — E559 Vitamin D deficiency, unspecified: Secondary | ICD-10-CM

## 2016-01-14 NOTE — Progress Notes (Signed)
Subjective:    Sherri Allison is a 75 y.o. female who presents for Medicare Annual/Subsequent preventive examination.  Preventive Screening-Counseling & Management  Tobacco History  Smoking status  . Never Smoker   Smokeless tobacco  . Not on file     Problems Prior to Visit 1.   Current Problems (verified) Patient Active Problem List   Diagnosis Date Noted  . Hyperlipidemia 08/01/2010  . UNSPECIFIED VITAMIN D DEFICIENCY 07/23/2010  . SCIATICA 05/11/2009  . BACK PAIN, LUMBAR, WITH RADICULOPATHY 02/14/2008  . UTERINE PROLAPSE 11/23/2007  . Osteoporosis 11/02/2007  . OVERACTIVE BLADDER 11/01/2007    Medications Prior to Visit Current Outpatient Prescriptions on File Prior to Visit  Medication Sig Dispense Refill  . aspirin 81 MG chewable tablet Chew 81 mg by mouth daily.    . cholecalciferol (VITAMIN D) 1000 UNITS tablet Take 2,000 Units by mouth daily.     . Multiple Vitamins-Minerals (CENTRUM SILVER PO) Take 1 tablet by mouth every morning.     No current facility-administered medications on file prior to visit.    Current Medications (verified) Current Outpatient Prescriptions  Medication Sig Dispense Refill  . aspirin 81 MG chewable tablet Chew 81 mg by mouth daily.    . cholecalciferol (VITAMIN D) 1000 UNITS tablet Take 2,000 Units by mouth daily.     . Multiple Vitamins-Minerals (CENTRUM SILVER PO) Take 1 tablet by mouth every morning.     No current facility-administered medications for this visit.     Allergies (verified) Review of patient's allergies indicates no known allergies.   PAST HISTORY  Family History Family History  Problem Relation Age of Onset  . Prostate cancer Brother   . Fibromyalgia Sister   . Pancreatic cancer Mother     Social History Social History  Substance Use Topics  . Smoking status: Never Smoker   . Smokeless tobacco: Not on file  . Alcohol Use: No     Are there smokers in your home (other than you)? No  Risk  Factors Current exercise habits: walk or elliptical machine for at least 10-15 minutes daily.  Dietary issues discussed: none   Cardiac risk factors: advanced age (older than 63 for men, 92 for women).  Depression Screen (Note: if answer to either of the following is "Yes", a more complete depression screening is indicated)   Over the past two weeks, have you felt down, depressed or hopeless? No  Over the past two weeks, have you felt little interest or pleasure in doing things? No  Have you lost interest or pleasure in daily life? No  Do you often feel hopeless? No  Do you cry easily over simple problems? No  Activities of Daily Living In your present state of health, do you have any difficulty performing the following activities?:  Driving? No Managing money?  No Feeding yourself? No Getting from bed to chair? No   Climbing a flight of stairs? No Preparing food and eating?: No Bathing or showering? No Getting dressed: No Getting to the toilet? No Using the toilet:No Moving around from place to place: No In the past year have you fallen or had a near fall?:No     Hearing Difficulties: No Do you often ask people to speak up or repeat themselves? No Do you experience ringing or noises in your ears? No Do you have difficulty understanding soft or whispered voices? No   Do you feel that you have a problem with memory? No  Do you often misplace items?  No  Do you feel safe at home?  Yes  Cognitive Testing  Alert? Yes  Normal Appearance?Yes  Oriented to person? Yes  Place? Yes   Time? Yes  Recall of three objects?  Yes  Can perform simple calculations? Yes  Displays appropriate judgment?Yes  Can read the correct time from a watch face?Yes   Advanced Directives have been discussed with the patient? Yes  List the Names of Other Physician/Practitioners you currently use: 1.    Indicate any recent Medical Services you may have received from other than [facility] providers in the  past year (date may be approximate).  Immunization History  Administered Date(s) Administered  . Pneumococcal Conjugate-13 06/26/2015  . Pneumococcal Polysaccharide-23 11/23/2007  . Td 11/23/2007  . Zoster 07/23/2010    Screening Tests Health Maintenance  Topic Date Due  . COLONOSCOPY  12/18/2014  . INFLUENZA VACCINE  01/16/2018 (Originally 05/20/2015)  . MAMMOGRAM  10/31/2016  . TETANUS/TDAP  11/22/2017  . DEXA SCAN  Completed  . ZOSTAVAX  Completed  . PNA vac Low Risk Adult  Completed    All answers were reviewed with the patient and necessary referrals were made:  Lucia Mccreadie, MD   01/14/2016   History reviewed: allergies, current medications, past family history, past medical history, past social history, past surgical history and problem list  Review of Systems A comprehensive review of systems was negative.    Objective:     Vision by Snellen chart: ey exam is next month   Body mass index is 27.64 kg/(m^2). BP 138/81 mmHg  Pulse 82  Wt 156 lb (70.761 kg)  SpO2 98%  BP 138/81 mmHg  Pulse 82  Wt 156 lb (70.761 kg)  SpO2 98% General appearance: alert, cooperative and appears stated age Head: Normocephalic, without obvious abnormality, atraumatic Eyes: conj clear, EOMI, pEERLA Ears: normal TM's and external ear canals both ears Nose: Nares normal. Septum midline. Mucosa normal. No drainage or sinus tenderness. Throat: lips, mucosa, and tongue normal; teeth and gums normal Neck: no adenopathy, no carotid bruit, no JVD, supple, symmetrical, trachea midline and thyroid not enlarged, symmetric, no tenderness/mass/nodules Back: symmetric, no curvature. ROM normal. No CVA tenderness. Lungs: clear to auscultation bilaterally Breasts: normal appearance, no masses or tenderness Heart: regular rate and rhythm, S1, S2 normal, no murmur, click, rub or gallop Abdomen: soft, non-tender; bowel sounds normal; no masses,  no organomegaly Extremities: extremities normal,  atraumatic, no cyanosis or edema Pulses: 2+ and symmetric Skin: Skin color, texture, turgor normal. No rashes or lesions Lymph nodes: Cervical, supraclavicular, and axillary nodes normal. Neurologic: Alert and oriented X 3, normal strength and tone. Normal symmetric reflexes. Normal coordination and gait     Assessment:     Annual Wellness Exam - Medicare       Plan:     During the course of the visit the patient was educated and counseled about appropriate screening and preventive services including:    Vaccines are up to date.    Bone density is UTD  She completed her Cologuard. Awaiting results.  Will call for results at end of week if we haven't received anything.   Diet review for nutrition referral? Yes ____  Not Indicated _X__   Patient Instructions (the written plan) was given to the patient.  Medicare Attestation I have personally reviewed: The patient's medical and social history Their use of alcohol, tobacco or illicit drugs Their current medications and supplements The patient's functional ability including ADLs,fall risks, home safety risks, cognitive,  and hearing and visual impairment Diet and physical activities Evidence for depression or mood disorders  The patient's weight, height, BMI, and visual acuity have been recorded in the chart.  I have made referrals, counseling, and provided education to the patient based on review of the above and I have provided the patient with a written personalized care plan for preventive services.     Jahi Roza, MD   01/14/2016

## 2016-01-15 ENCOUNTER — Ambulatory Visit (INDEPENDENT_AMBULATORY_CARE_PROVIDER_SITE_OTHER): Payer: Medicare Other

## 2016-01-15 DIAGNOSIS — Z1231 Encounter for screening mammogram for malignant neoplasm of breast: Secondary | ICD-10-CM | POA: Diagnosis not present

## 2016-01-15 LAB — COMPLETE METABOLIC PANEL WITH GFR
ALT: 8 U/L (ref 6–29)
AST: 17 U/L (ref 10–35)
Albumin: 4.4 g/dL (ref 3.6–5.1)
Alkaline Phosphatase: 52 U/L (ref 33–130)
BILIRUBIN TOTAL: 1.3 mg/dL — AB (ref 0.2–1.2)
BUN: 26 mg/dL — AB (ref 7–25)
CALCIUM: 9.8 mg/dL (ref 8.6–10.4)
CHLORIDE: 106 mmol/L (ref 98–110)
CO2: 27 mmol/L (ref 20–31)
CREATININE: 1.02 mg/dL — AB (ref 0.60–0.93)
GFR, Est African American: 63 mL/min (ref >=60)
GFR, Est Non African American: 55 mL/min — ABNORMAL LOW (ref >=60)
Glucose, Bld: 90 mg/dL (ref 65–99)
Potassium: 4.7 mmol/L (ref 3.5–5.3)
Sodium: 143 mmol/L (ref 135–146)
TOTAL PROTEIN: 6.8 g/dL (ref 6.1–8.1)

## 2016-01-15 LAB — LIPID PANEL
CHOLESTEROL: 215 mg/dL — AB (ref 125–200)
HDL: 51 mg/dL (ref >=46)
LDL CALC: 139 mg/dL — AB (ref <130)
Total CHOL/HDL Ratio: 4.2 Ratio (ref <=5.0)
Triglycerides: 123 mg/dL (ref <150)
VLDL: 25 mg/dL (ref <30)

## 2016-01-16 ENCOUNTER — Encounter: Payer: Self-pay | Admitting: Family Medicine

## 2016-01-16 DIAGNOSIS — N1831 Chronic kidney disease, stage 3a: Secondary | ICD-10-CM | POA: Insufficient documentation

## 2016-01-16 DIAGNOSIS — N183 Chronic kidney disease, stage 3 unspecified: Secondary | ICD-10-CM | POA: Insufficient documentation

## 2016-01-16 LAB — VITAMIN D 25 HYDROXY (VIT D DEFICIENCY, FRACTURES): Vit D, 25-Hydroxy: 33 ng/mL (ref 30–100)

## 2016-01-24 ENCOUNTER — Encounter: Payer: Self-pay | Admitting: Family Medicine

## 2016-02-17 DIAGNOSIS — H35351 Cystoid macular degeneration, right eye: Secondary | ICD-10-CM | POA: Diagnosis not present

## 2016-03-09 DIAGNOSIS — H43813 Vitreous degeneration, bilateral: Secondary | ICD-10-CM | POA: Diagnosis not present

## 2016-03-09 DIAGNOSIS — H353211 Exudative age-related macular degeneration, right eye, with active choroidal neovascularization: Secondary | ICD-10-CM | POA: Diagnosis not present

## 2016-03-09 DIAGNOSIS — H353123 Nonexudative age-related macular degeneration, left eye, advanced atrophic without subfoveal involvement: Secondary | ICD-10-CM | POA: Diagnosis not present

## 2016-03-27 DIAGNOSIS — H353211 Exudative age-related macular degeneration, right eye, with active choroidal neovascularization: Secondary | ICD-10-CM | POA: Diagnosis not present

## 2016-05-01 DIAGNOSIS — H43813 Vitreous degeneration, bilateral: Secondary | ICD-10-CM | POA: Diagnosis not present

## 2016-05-01 DIAGNOSIS — H353211 Exudative age-related macular degeneration, right eye, with active choroidal neovascularization: Secondary | ICD-10-CM | POA: Diagnosis not present

## 2016-05-01 DIAGNOSIS — H353123 Nonexudative age-related macular degeneration, left eye, advanced atrophic without subfoveal involvement: Secondary | ICD-10-CM | POA: Diagnosis not present

## 2016-06-26 DIAGNOSIS — H353123 Nonexudative age-related macular degeneration, left eye, advanced atrophic without subfoveal involvement: Secondary | ICD-10-CM | POA: Diagnosis not present

## 2016-06-26 DIAGNOSIS — H43813 Vitreous degeneration, bilateral: Secondary | ICD-10-CM | POA: Diagnosis not present

## 2016-06-26 DIAGNOSIS — H353211 Exudative age-related macular degeneration, right eye, with active choroidal neovascularization: Secondary | ICD-10-CM | POA: Diagnosis not present

## 2016-09-18 DIAGNOSIS — H43813 Vitreous degeneration, bilateral: Secondary | ICD-10-CM | POA: Diagnosis not present

## 2016-09-18 DIAGNOSIS — H353123 Nonexudative age-related macular degeneration, left eye, advanced atrophic without subfoveal involvement: Secondary | ICD-10-CM | POA: Diagnosis not present

## 2016-09-18 DIAGNOSIS — H353211 Exudative age-related macular degeneration, right eye, with active choroidal neovascularization: Secondary | ICD-10-CM | POA: Diagnosis not present

## 2016-12-28 LAB — FECAL OCCULT BLOOD, GUAIAC: FECAL OCCULT BLD: NEGATIVE

## 2017-01-07 ENCOUNTER — Encounter: Payer: Self-pay | Admitting: Family Medicine

## 2017-01-27 DIAGNOSIS — H353212 Exudative age-related macular degeneration, right eye, with inactive choroidal neovascularization: Secondary | ICD-10-CM | POA: Diagnosis not present

## 2017-01-27 DIAGNOSIS — H353123 Nonexudative age-related macular degeneration, left eye, advanced atrophic without subfoveal involvement: Secondary | ICD-10-CM | POA: Diagnosis not present

## 2017-01-27 DIAGNOSIS — H43813 Vitreous degeneration, bilateral: Secondary | ICD-10-CM | POA: Diagnosis not present

## 2017-03-30 ENCOUNTER — Ambulatory Visit (INDEPENDENT_AMBULATORY_CARE_PROVIDER_SITE_OTHER): Payer: Medicare Other | Admitting: Family Medicine

## 2017-03-30 ENCOUNTER — Encounter: Payer: Self-pay | Admitting: Family Medicine

## 2017-03-30 ENCOUNTER — Ambulatory Visit (INDEPENDENT_AMBULATORY_CARE_PROVIDER_SITE_OTHER): Payer: Medicare Other

## 2017-03-30 VITALS — BP 132/62 | HR 86 | Ht 62.21 in | Wt 155.0 lb

## 2017-03-30 DIAGNOSIS — Z0000 Encounter for general adult medical examination without abnormal findings: Secondary | ICD-10-CM | POA: Diagnosis not present

## 2017-03-30 DIAGNOSIS — Z9289 Personal history of other medical treatment: Secondary | ICD-10-CM | POA: Diagnosis not present

## 2017-03-30 DIAGNOSIS — N183 Chronic kidney disease, stage 3 unspecified: Secondary | ICD-10-CM

## 2017-03-30 DIAGNOSIS — M810 Age-related osteoporosis without current pathological fracture: Secondary | ICD-10-CM

## 2017-03-30 DIAGNOSIS — Z1231 Encounter for screening mammogram for malignant neoplasm of breast: Secondary | ICD-10-CM | POA: Diagnosis not present

## 2017-03-30 LAB — COMPLETE METABOLIC PANEL WITH GFR
ALT: 7 U/L (ref 6–29)
AST: 15 U/L (ref 10–35)
Albumin: 4.3 g/dL (ref 3.6–5.1)
Alkaline Phosphatase: 58 U/L (ref 33–130)
BUN: 20 mg/dL (ref 7–25)
CO2: 26 mmol/L (ref 20–31)
Calcium: 9.8 mg/dL (ref 8.6–10.4)
Chloride: 102 mmol/L (ref 98–110)
Creat: 0.99 mg/dL — ABNORMAL HIGH (ref 0.60–0.93)
GFR, EST NON AFRICAN AMERICAN: 56 mL/min — AB (ref >=60)
GFR, Est African American: 65 mL/min (ref >=60)
GLUCOSE: 96 mg/dL (ref 65–99)
POTASSIUM: 4.7 mmol/L (ref 3.5–5.3)
SODIUM: 139 mmol/L (ref 135–146)
TOTAL PROTEIN: 7 g/dL (ref 6.1–8.1)
Total Bilirubin: 1.7 mg/dL — ABNORMAL HIGH (ref 0.2–1.2)

## 2017-03-30 LAB — TSH: TSH: 1.36 mIU/L

## 2017-03-30 LAB — LIPID PANEL W/REFLEX DIRECT LDL
CHOLESTEROL: 234 mg/dL — AB (ref <200)
HDL: 54 mg/dL (ref >50)
LDL-CHOLESTEROL: 150 mg/dL — AB
Non-HDL Cholesterol (Calc): 180 mg/dL — ABNORMAL HIGH (ref <130)
TRIGLYCERIDES: 160 mg/dL — AB (ref <150)
Total CHOL/HDL Ratio: 4.3 Ratio (ref <5.0)

## 2017-03-30 NOTE — Progress Notes (Signed)
Subjective:   Sherri Allison is a 76 y.o. female who presents for Medicare Annual (Subsequent) preventive examination.  Review of Systems:  Comprehensive ROS is negative.    Cancer screening-she does yearly stool cards.    Objective:     Vitals: BP 132/62   Pulse 86   Ht 5' 2.21" (1.58 m)   Wt 155 lb (70.3 kg)   SpO2 98%   BMI 28.16 kg/m   Body mass index is 28.16 kg/m.  Physical Exam  Constitutional: She is oriented to person, place, and time. She appears well-developed and well-nourished.  HENT:  Head: Normocephalic and atraumatic.  Right Ear: External ear normal.  Left Ear: External ear normal.  Nose: Nose normal.  Mouth/Throat: Oropharynx is clear and moist.  TMs and canals are clear.   Eyes: Conjunctivae and EOM are normal. Pupils are equal, round, and reactive to light.  Neck: Neck supple. No thyromegaly present.  Cardiovascular: Normal rate, regular rhythm and normal heart sounds.   Pulmonary/Chest: Effort normal and breath sounds normal. She has no wheezes.  Abdominal: Soft. Bowel sounds are normal. She exhibits no distension and no mass. There is no tenderness. There is no guarding.  Lymphadenopathy:    She has no cervical adenopathy.  Neurological: She is alert and oriented to person, place, and time.  Skin: Skin is warm and dry. No pallor.  Psychiatric: She has a normal mood and affect. Her behavior is normal.  Vitals reviewed.    Tobacco History  Smoking Status  . Never Smoker  Smokeless Tobacco  . Never Used     Counseling given: Not Answered   Past Medical History:  Diagnosis Date  . Fibroids    Seen on ultrasound in 2006.  Marland Kitchen Normal cardiac stress test    2006   Past Surgical History:  Procedure Laterality Date  . ABDOMINAL HYSTERECTOMY    . APPENDECTOMY    . discketcomy    . INCONTINENCE SURGERY     Family History  Problem Relation Age of Onset  . Prostate cancer Brother   . Fibromyalgia Sister   . Pancreatic cancer Mother     History  Sexual Activity  . Sexual activity: Not on file    Outpatient Encounter Prescriptions as of 03/30/2017  Medication Sig  . aspirin 81 MG chewable tablet Chew 81 mg by mouth daily.  . cholecalciferol (VITAMIN D) 1000 UNITS tablet Take 2,000 Units by mouth daily.   . Multiple Vitamins-Minerals (PRESERVISION AREDS 2 PO) Take 1 capsule by mouth daily.  . [DISCONTINUED] Multiple Vitamins-Minerals (CENTRUM SILVER PO) Take 1 tablet by mouth every morning.   No facility-administered encounter medications on file as of 03/30/2017.     Activities of Daily Living No flowsheet data found.  Patient Care Team: Hali Marry, MD as PCP - General (Family Medicine)    Assessment:    Medicare Wellness exam   Exercise Activities and Dietary recommendations    Goals    None      Fall Risk Fall Risk  03/30/2017 01/14/2016 10/22/2014 09/21/2013 09/21/2013  Falls in the past year? No No No No No   Depression Screen PHQ 2/9 Scores 03/30/2017 01/14/2016 10/22/2014 09/21/2013  PHQ - 2 Score 0 0 0 0     Cognitive Function     6CIT Screen 03/30/2017  What Year? 0 points  What month? 0 points  What time? 0 points  Count back from 20 0 points  Months in reverse 2 points  Repeat phrase 2 points  Total Score 4    Immunization History  Administered Date(s) Administered  . Pneumococcal Conjugate-13 06/26/2015  . Pneumococcal Polysaccharide-23 11/23/2007  . Td 11/23/2007  . Zoster 07/23/2010   Screening Tests Health Maintenance  Topic Date Due  . INFLUENZA VACCINE  01/16/2018 (Originally 05/19/2017)  . TETANUS/TDAP  11/22/2017  . COLON CANCER SCREENING ANNUAL FOBT  12/18/2017  . MAMMOGRAM  01/14/2018  . DEXA SCAN  Completed  . PNA vac Low Risk Adult  Completed      Plan:      I have personally reviewed and noted the following in the patient's chart:   . Medical and social history - updated.  . Use of alcohol, tobacco or illicit drugs  . Current medications and  supplements- updated.  . Functional ability and status - normal 6 CIT . Nutritional status - doing wel.  . Physical activity - exercises daily with walking and eliptical.  . Advanced directives . List of other physicians . Hospitalizations, surgeries, and ER visits in previous 12 months - had a retinal bleed.   . Vitals . Screenings to include cognitive, depression, and falls -no concerns. done . Referrals and appointments . CKD- due to recheck labs  In addition, I have reviewed and discussed with patient certain preventive protocols, quality metrics, and best practice recommendations. A written personalized care plan for preventive services as well as general preventive health recommendations were provided to patient.     Dhani Imel, MD  03/31/2017

## 2017-03-30 NOTE — Patient Instructions (Addendum)
Keep up the good work on regular exercise. We'll call you with the lab results once available. Mammogram ordered.   Chronic Kidney Disease, Adult Chronic kidney disease (CKD) occurs when the kidneys are damaged during a period of 3 or more months. The kidneys are two organs that do many important jobs in the body, which include:  Removing wastes and extra fluids from the blood.  Making hormones that maintain the amount of fluid in your tissues and blood vessels.  Maintaining the right amount of fluids and chemicals in the body.  A small amount of kidney damage may not cause problems, but a large amount of damage may make it difficult or impossible for the kidneys to work the way they should. If steps are not taken to slow down the kidney damage or stop it from getting worse, the kidneys may stop working permanently (end stage kidney disease). Most of the time, CKD does not go away, but it can often be controlled. People who have CKD can usually live normal lives. What are the causes? The most common causes of this condition are diabetes and high blood pressure (hypertension). Other causes include:  Heart and blood vessel (cardiovascular) disease.  Kidney diseases: ? Glomerulonephritis. ? Interstitial nephritis. ? Polycystic kidney disease. ? Renal vascular disease.  Diseases that affect the immune system.  Genetic diseases.  Medicines that damage the kidneys, such as anti-inflammatory medicines.  Poisoning.  Being around or in contact with poisonous (toxic) substances.  A kidney or urinary infection that occurs again (recurs).  Vasculitis.  Repeat kidney infections.  A problem with urine flow that may be caused by: ? Cancer. ? Having kidney stones more than one time. ? An enlarged prostate in males.  What increases the risk? This condition is more likely to develop in people who are:  Older than age 68.  Female.  Of African-American descent.  Current smokers or  former smokers.  Obese.  You may also have an increased risk for CKD if you have a family history of CKDor youfrequently take medicines that are damaging to the kidneys. What are the signs or symptoms? Symptoms develop slowly and may not be obvious until the kidney damage becomes severe. It is possible to have a kidney disease for years without showing any symptoms. Symptoms of this condition can include:  Swelling (edema) of the face, legs, ankles, or feet.  Numbness, tingling, or loss of feeling (sensation) in the hands or feet.  Tiredness (lethargy).  Nausea or vomiting.  Confusion or trouble concentrating.  Problems with urination, such as: ? Painful or burning feeling during urination. ? Decreased urine production. ? Frequent urination, especially at night. ? Bloody urine.  Muscle twitches and cramps, especially in the legs.  Shortness of breath.  Weakness.  Constant itchiness.  Loss of appetite.  Metallic taste in the mouth.  Trouble sleeping.  Pale lining of the eyelids and surface of the eye (conjunctiva).  How is this diagnosed? This condition may be diagnosed with various tests. Tests may include:  Blood tests.  Urine tests.  Imaging tests.  A test in which a sample of tissue is removed from the kidneys to be looked at under a microscope (kidney biopsy).  These test results will help your health care provider determine what class of CKD you have. How is this treated? Most cases of CKD cannot be cured. Treatment usually involves relieving symptoms and preventing or slowing the progression of the disease. Treatment may include:  A special diet,  which may require you to avoid alcohol, salty foods (sodium), and foods that are high in potassium, calcium, and protein.  Medicines: ? To lower blood pressure. ? To relieve low blood count (anemia). ? To relieve swelling. ? To protect your bones. ? To improve the balance of electrolytes in your  blood.  Removing toxic waste from the body by using hemodialysis or peritoneal dialysis if the kidneys can no longer do their job (kidney failure).  Management of other conditions that are causing your CKD or making it worse.  Follow these instructions at home:  Follow your prescribed diet.  Take over-the-counter and prescription medicines only as told by your health care provider. ? Do not take any new medicines unless approved by your health care provider. Many medicines can worsen your kidney damage. ? Do not take any vitamin and mineral supplements unless approved by your health care provider. Many nutritional supplements can worsen your kidney damage. ? The dose of some medicines that you take may need to be adjusted.  Do not use any tobacco products, such as cigarettes, chewing tobacco, and e-cigarettes. If you need help quitting, ask your health care provider.  Keep all follow-up visits as told by your health care provider. This is important.  Keep track of your blood pressure. Report changes in your blood pressure as told by your health care provider.  Achieve and maintain a healthy weight. If you need help with this, ask your health care provider.  Start or continue an exercise plan. Try to exercise at least 30 minutes a day, 5 days a week.  Stay current with immunizations as told by your health care provider. Where to find more information:  American Association of Kidney Patients: BombTimer.gl  National Kidney Foundation: www.kidney.[place]: https://mathis.com/  [facility]: www.lifeoptions.org and www.kidneyschool.org Contact a health care provider if:  Your symptoms get worse.  You develop new symptoms. Get help right away if:  You develop symptoms of end-stage kidney disease, which include: ? Headaches. ? Abnormally dark or light skin. ? Numbness in the hands or feet. ? Easy bruising. ? Frequent hiccups. ? Chest  pain. ? Shortness of breath. ? End of menstruation in women.  You have a fever.  You have decreased urine production.  You have pain or bleeding when you urinate. This information is not intended to replace advice given to you by your health care provider. Make sure you discuss any questions you have with your health care provider. Document Released: 07/14/2008 Document Revised: 03/12/2016 Document Reviewed: 06/03/2012 Elsevier Interactive Patient Education  2017 [employer].

## 2017-03-31 LAB — VITAMIN D 25 HYDROXY (VIT D DEFICIENCY, FRACTURES): Vit D, 25-Hydroxy: 34 ng/mL (ref 30–100)

## 2017-05-05 DIAGNOSIS — H353212 Exudative age-related macular degeneration, right eye, with inactive choroidal neovascularization: Secondary | ICD-10-CM | POA: Diagnosis not present

## 2017-05-05 DIAGNOSIS — H353123 Nonexudative age-related macular degeneration, left eye, advanced atrophic without subfoveal involvement: Secondary | ICD-10-CM | POA: Diagnosis not present

## 2017-08-04 DIAGNOSIS — H353123 Nonexudative age-related macular degeneration, left eye, advanced atrophic without subfoveal involvement: Secondary | ICD-10-CM | POA: Diagnosis not present

## 2017-08-04 DIAGNOSIS — H2513 Age-related nuclear cataract, bilateral: Secondary | ICD-10-CM | POA: Diagnosis not present

## 2017-08-04 DIAGNOSIS — H43813 Vitreous degeneration, bilateral: Secondary | ICD-10-CM | POA: Diagnosis not present

## 2017-08-04 DIAGNOSIS — H353213 Exudative age-related macular degeneration, right eye, with inactive scar: Secondary | ICD-10-CM | POA: Diagnosis not present

## 2017-11-03 ENCOUNTER — Other Ambulatory Visit: Payer: Self-pay

## 2017-11-03 ENCOUNTER — Emergency Department
Admission: EM | Admit: 2017-11-03 | Discharge: 2017-11-03 | Disposition: A | Discharge status: Home / Self Care | Payer: Medicare Other | Source: Home / Self Care | Attending: Family Medicine | Admitting: Family Medicine

## 2017-11-03 ENCOUNTER — Encounter: Payer: Self-pay | Admitting: *Deleted

## 2017-11-03 DIAGNOSIS — J209 Acute bronchitis, unspecified: Secondary | ICD-10-CM

## 2017-11-03 DIAGNOSIS — R062 Wheezing: Secondary | ICD-10-CM

## 2017-11-03 MED ORDER — ALBUTEROL SULFATE HFA 108 (90 BASE) MCG/ACT IN AERS
1.0000 | INHALATION_SPRAY | Freq: Four times a day (QID) | RESPIRATORY_TRACT | 0 refills | Status: DC | PRN
Start: 2017-11-03 — End: 2018-07-04

## 2017-11-03 MED ORDER — IPRATROPIUM-ALBUTEROL 0.5-2.5 (3) MG/3ML IN SOLN
3.0000 mL | Freq: Once | RESPIRATORY_TRACT | Status: AC
  Administered 2017-11-03: 3 mL via RESPIRATORY_TRACT

## 2017-11-03 MED ORDER — PREDNISONE 20 MG PO TABS: ORAL_TABLET | ORAL | 0 refills | Status: DC

## 2017-11-03 MED ORDER — AZITHROMYCIN 250 MG PO TABS: 250.0000 mg | ORAL_TABLET | Freq: Every day | ORAL | 0 refills | Status: DC

## 2017-11-03 MED ORDER — BENZONATATE 100 MG PO CAPS: 100.0000 mg | ORAL_CAPSULE | Freq: Three times a day (TID) | ORAL | 0 refills | Status: DC

## 2017-11-03 MED ORDER — METHYLPREDNISOLONE SODIUM SUCC 40 MG IJ SOLR
80.0000 mg | Freq: Once | INTRAMUSCULAR | Status: AC
  Administered 2017-11-03: 80 mg via INTRAMUSCULAR

## 2017-11-03 NOTE — ED Triage Notes (Signed)
Pt c/o non producitve cough x 10 days. Reports hearing wheezing last night. She has taken Alka Seltzer, Robt D and Nyquil without relief. Denies fever.

## 2017-11-03 NOTE — ED Provider Notes (Signed)
[name]    CSN: [id number] Arrival date & time: 11/03/17  1055     History   Chief Complaint Chief Complaint  Patient presents with  . Cough    HPI Sherri Allison is a 77 y.o. female.   HPI  Sherri Allison is a 77 y.o. female presenting to UC with c/o 10 days of gradually worsening productive cough with chest tightness and wheeze that started last night.  She has been taking Alka seltzer, robitussin D, nad Nyquil w/o relief.  No hx of asthma or COPD.  No known sick contacts.    Past Medical History:  Diagnosis Date  . Fibroids    Seen on ultrasound in 2006.  Marland Kitchen Normal cardiac stress test    2006    Patient Active Problem List   Diagnosis Date Noted  . CKD (chronic kidney disease) stage 3, GFR 30-59 ml/min (HCC) 01/16/2016  . Hyperlipidemia 08/01/2010  . UNSPECIFIED VITAMIN D DEFICIENCY 07/23/2010  . SCIATICA 05/11/2009  . BACK PAIN, LUMBAR, WITH RADICULOPATHY 02/14/2008  . UTERINE PROLAPSE 11/23/2007  . Osteoporosis 11/02/2007  . OVERACTIVE BLADDER 11/01/2007    Past Surgical History:  Procedure Laterality Date  . ABDOMINAL HYSTERECTOMY    . APPENDECTOMY    . discketcomy    . INCONTINENCE SURGERY      OB History    No data available       Home Medications    Prior to Admission medications   Medication Sig Start Date End Date Taking? Authorizing Provider  albuterol (PROVENTIL HFA;VENTOLIN HFA) 108 (90 Base) MCG/ACT inhaler Inhale 1-2 puffs into the lungs every 6 (six) hours as needed for wheezing or shortness of breath. 11/03/17   Noe Gens, PA-C  aspirin 81 MG chewable tablet Chew 81 mg by mouth daily.    [provider]  azithromycin (ZITHROMAX) 250 MG tablet Take 1 tablet (250 mg total) by mouth daily. Take first 2 tablets together, then 1 every day until finished. 11/03/17   Noe Gens, PA-C  benzonatate (TESSALON) 100 MG capsule Take 1-2 capsules (100-200 mg total) by mouth every 8 (eight) hours. 11/03/17   Noe Gens, PA-C  cholecalciferol (VITAMIN D) 1000 UNITS tablet Take 2,000 Units by mouth daily.     [provider]  Multiple Vitamins-Minerals (PRESERVISION AREDS 2 PO) Take 1 capsule by mouth daily.    [provider]  predniSONE (DELTASONE) 20 MG tablet 3 tabs po day one, then 2 po daily x 4 days 11/03/17   Noe Gens, PA-C    Family History Family History  Problem Relation Age of Onset  . Prostate cancer Brother   . Fibromyalgia Sister   . Pancreatic cancer Mother     Social History Social History   Tobacco Use  . Smoking status: Never Smoker  . Smokeless tobacco: Never Used  Substance Use Topics  . Alcohol use: No  . Drug use: No     Allergies   Patient has no known allergies.   Review of Systems Review of Systems  Constitutional: Negative for chills and fever.  HENT: Positive for congestion. Negative for ear pain, sore throat, trouble swallowing and voice change.   Respiratory: Positive for cough, chest tightness and wheezing. Negative for shortness of breath.   Cardiovascular: Negative for chest pain and palpitations.  Gastrointestinal: Negative for abdominal pain, diarrhea, nausea and vomiting.  Musculoskeletal: Negative for arthralgias, back pain and myalgias.  Skin: Negative for rash.  Physical Exam Triage Vital Signs ED Triage Vitals [11/03/17 1123]  Enc Vitals Group     BP (!) 149/89     Pulse Rate 95     Resp 18     Temp 99.1 F (37.3 C)     Temp Source Oral     SpO2 97 %     Weight 159 lb (72.1 kg)     Height 5\' 2"  (1.575 m)     Head Circumference      Peak Flow      Pain Score 0     Pain Loc      Pain Edu?      Excl. in [place]?    No data found.  Updated Vital Signs BP (!) 149/89 (BP Location: Right Arm)   Pulse 95   Temp 99.1 F (37.3 C) (Oral)   Resp 18   Ht 5\' 2"  (1.575 m)   Wt 159 lb (72.1 kg)   SpO2 97%   BMI 29.08 kg/m     Physical Exam  Constitutional: She is oriented to person, place, and time. She  appears well-developed and well-nourished. No distress.  HENT:  Head: Normocephalic and atraumatic.  Right Ear: Tympanic membrane normal.  Left Ear: Tympanic membrane normal.  Nose: Nose normal.  Mouth/Throat: Uvula is midline, oropharynx is clear and moist and mucous membranes are normal.  Eyes: EOM are normal.  Neck: Normal range of motion. Neck supple.  Cardiovascular: Normal rate and regular rhythm.  Pulmonary/Chest: Effort normal. No stridor. No respiratory distress. She has wheezes. She has rales.  Diffuse wheeze and coarse breath sounds, worse in Left lower lung field  Musculoskeletal: Normal range of motion.  Neurological: She is alert and oriented to person, place, and time.  Skin: Skin is warm and dry. She is not diaphoretic.  Psychiatric: She has a normal mood and affect. Her behavior is normal.  Nursing note and vitals reviewed.    UC Treatments / Results  Labs (all labs ordered are listed, but only abnormal results are displayed) Labs Reviewed - No data to display  EKG  EKG Interpretation None       Radiology No results found.  Procedures Procedures (including critical care time)  Medications Ordered in UC Medications  methylPREDNISolone sodium succinate (SOLU-MEDROL) 40 mg/mL injection 80 mg (80 mg Intramuscular Given 11/03/17 1150)  ipratropium-albuterol (DUONEB) 0.5-2.5 (3) MG/3ML nebulizer solution 3 mL (3 mLs Nebulization Given 11/03/17 1150)     Initial Impression / Assessment and Plan / UC Course  I have reviewed the triage vital signs and the nursing notes.  Pertinent labs & imaging results that were available during my care of the patient were reviewed by me and considered in my medical decision making (see chart for details).     Breathing treatment given in UC Lung sounds improved significantly but faint wheeze still present Pt states she feels like she can breath better and no longer feels urge to cough. Will cover for underlying bacterial  infection given duration of symptoms F/u with PCP next week if not improving.  Discussed symptoms that warrant emergent care in the ED.   Final Clinical Impressions(s) / UC Diagnoses   Final diagnoses:  Acute bronchitis, unspecified organism  Wheezing    ED Discharge Orders        Ordered    azithromycin (ZITHROMAX) 250 MG tablet  Daily     11/03/17 1202    predniSONE (DELTASONE) 20 MG tablet     11/03/17 1202  benzonatate (TESSALON) 100 MG capsule  Every 8 hours     11/03/17 1202    albuterol (PROVENTIL HFA;VENTOLIN HFA) 108 (90 Base) MCG/ACT inhaler  Every 6 hours PRN     11/03/17 1202       Controlled Substance Prescriptions [place] Controlled Substance Registry consulted? Not Applicable   Tyrell Antonio 11/03/17 1347

## 2017-11-12 DIAGNOSIS — H353123 Nonexudative age-related macular degeneration, left eye, advanced atrophic without subfoveal involvement: Secondary | ICD-10-CM | POA: Diagnosis not present

## 2017-11-12 DIAGNOSIS — H353213 Exudative age-related macular degeneration, right eye, with inactive scar: Secondary | ICD-10-CM | POA: Diagnosis not present

## 2017-11-16 DIAGNOSIS — H25812 Combined forms of age-related cataract, left eye: Secondary | ICD-10-CM | POA: Diagnosis not present

## 2017-11-16 DIAGNOSIS — H52202 Unspecified astigmatism, left eye: Secondary | ICD-10-CM | POA: Diagnosis not present

## 2017-11-16 DIAGNOSIS — H43812 Vitreous degeneration, left eye: Secondary | ICD-10-CM | POA: Diagnosis not present

## 2017-11-16 DIAGNOSIS — H25811 Combined forms of age-related cataract, right eye: Secondary | ICD-10-CM | POA: Insufficient documentation

## 2018-02-02 DIAGNOSIS — H353213 Exudative age-related macular degeneration, right eye, with inactive scar: Secondary | ICD-10-CM | POA: Diagnosis not present

## 2018-02-02 DIAGNOSIS — H43813 Vitreous degeneration, bilateral: Secondary | ICD-10-CM | POA: Diagnosis not present

## 2018-02-02 DIAGNOSIS — H353123 Nonexudative age-related macular degeneration, left eye, advanced atrophic without subfoveal involvement: Secondary | ICD-10-CM | POA: Diagnosis not present

## 2018-02-02 DIAGNOSIS — H2511 Age-related nuclear cataract, right eye: Secondary | ICD-10-CM | POA: Diagnosis not present

## 2018-06-08 ENCOUNTER — Encounter: Payer: Self-pay | Admitting: Family Medicine

## 2018-07-04 ENCOUNTER — Encounter: Payer: Self-pay | Admitting: Family Medicine

## 2018-07-04 ENCOUNTER — Other Ambulatory Visit: Payer: Self-pay | Admitting: Family Medicine

## 2018-07-04 ENCOUNTER — Ambulatory Visit (INDEPENDENT_AMBULATORY_CARE_PROVIDER_SITE_OTHER): Payer: Medicare Other | Admitting: Family Medicine

## 2018-07-04 VITALS — BP 156/76 | HR 74 | Ht 62.0 in | Wt 163.0 lb

## 2018-07-04 DIAGNOSIS — Z0000 Encounter for general adult medical examination without abnormal findings: Secondary | ICD-10-CM | POA: Diagnosis not present

## 2018-07-04 DIAGNOSIS — Z1239 Encounter for other screening for malignant neoplasm of breast: Secondary | ICD-10-CM

## 2018-07-04 DIAGNOSIS — R7989 Other specified abnormal findings of blood chemistry: Secondary | ICD-10-CM

## 2018-07-04 DIAGNOSIS — N183 Chronic kidney disease, stage 3 unspecified: Secondary | ICD-10-CM

## 2018-07-04 DIAGNOSIS — E785 Hyperlipidemia, unspecified: Secondary | ICD-10-CM

## 2018-07-04 MED ORDER — AMBULATORY NON FORMULARY MEDICATION: 0 refills | Status: DC

## 2018-07-04 NOTE — Patient Instructions (Addendum)
Sherri Allison , Thank you for taking time to come for your Medicare Wellness Visit. I appreciate your ongoing commitment to your health goals. Please review the following plan we discussed and let me know if I can assist you in the future.   I would encourage you to get your tetanus vaccine done at the local pharmacy. Please see the information provided on the shingles vaccine I encourage you to get that done at your pharmacy as well. Keep up the great job on the regular exercise.  These are the goals we discussed: Goals    . Patient Stated     Get tetanus and shingles vaccines up dated.        This is a list of the screening recommended for you and due dates:  Health Maintenance  Topic Date Due  . Flu Shot  05/19/2018  . Tetanus Vaccine  07/05/2019*  . DEXA scan (bone density measurement)  Completed  . Pneumonia vaccines  Completed  *Topic was postponed. The date shown is not the original due date.     Health Maintenance for Postmenopausal Women Menopause is a normal process in which your reproductive ability comes to an end. This process happens gradually over a span of months to years, usually between the ages of 48 and 68. Menopause is complete when you have missed 12 consecutive menstrual periods. It is important to talk with your health care provider about some of the most common conditions that affect postmenopausal women, such as heart disease, cancer, and bone loss (osteoporosis). Adopting a healthy lifestyle and getting preventive care can help to promote your health and wellness. Those actions can also lower your chances of developing some of these common conditions. What should I know about menopause? During menopause, you may experience a number of symptoms, such as:  Moderate-to-severe hot flashes.  Night sweats.  Decrease in sex drive.  Mood swings.  Headaches.  Tiredness.  Irritability.  Memory problems.  Insomnia.  Choosing to treat or not to treat  menopausal changes is an individual decision that you make with your health care provider. What should I know about hormone replacement therapy and supplements? Hormone therapy products are effective for treating symptoms that are associated with menopause, such as hot flashes and night sweats. Hormone replacement carries certain risks, especially as you become older. If you are thinking about using estrogen or estrogen with progestin treatments, discuss the benefits and risks with your health care provider. What should I know about heart disease and stroke? Heart disease, heart attack, and stroke become more likely as you age. This may be due, in part, to the hormonal changes that your body experiences during menopause. These can affect how your body processes dietary fats, triglycerides, and cholesterol. Heart attack and stroke are both medical emergencies. There are many things that you can do to help prevent heart disease and stroke:  Have your blood pressure checked at least every 1-2 years. High blood pressure causes heart disease and increases the risk of stroke.  If you are 36-55 years old, ask your health care provider if you should take aspirin to prevent a heart attack or a stroke.  Do not use any tobacco products, including cigarettes, chewing tobacco, or electronic cigarettes. If you need help quitting, ask your health care provider.  It is important to eat a healthy diet and maintain a healthy weight. ? Be sure to include plenty of vegetables, fruits, low-fat dairy products, and lean protein. ? Avoid eating foods  that are high in solid fats, added sugars, or salt (sodium).  Get regular exercise. This is one of the most important things that you can do for your health. ? Try to exercise for at least 150 minutes each week. The type of exercise that you do should increase your heart rate and make you sweat. This is known as moderate-intensity exercise. ? Try to do strengthening  exercises at least twice each week. Do these in addition to the moderate-intensity exercise.  Know your numbers.Ask your health care provider to check your cholesterol and your blood glucose. Continue to have your blood tested as directed by your health care provider.  What should I know about cancer screening? There are several types of cancer. Take the following steps to reduce your risk and to catch any cancer development as early as possible. Breast Cancer  Practice breast self-awareness. ? This means understanding how your breasts normally appear and feel. ? It also means doing regular breast self-exams. Let your health care provider know about any changes, no matter how small.  If you are 69 or older, have a clinician do a breast exam (clinical breast exam or CBE) every year. Depending on your age, family history, and medical history, it may be recommended that you also have a yearly breast X-ray (mammogram).  If you have a family history of breast cancer, talk with your health care provider about genetic screening.  If you are at high risk for breast cancer, talk with your health care provider about having an MRI and a mammogram every year.  Breast cancer (BRCA) gene test is recommended for women who have family members with BRCA-related cancers. Results of the assessment will determine the need for genetic counseling and BRCA1 and for BRCA2 testing. BRCA-related cancers include these types: ? Breast. This occurs in males or females. ? Ovarian. ? Tubal. This may also be called fallopian tube cancer. ? Cancer of the abdominal or pelvic lining (peritoneal cancer). ? Prostate. ? Pancreatic.  Cervical, Uterine, and Ovarian Cancer Your health care provider may recommend that you be screened regularly for cancer of the pelvic organs. These include your ovaries, uterus, and vagina. This screening involves a pelvic exam, which includes checking for microscopic changes to the surface of  your cervix (Pap test).  For women ages 21-65, health care providers may recommend a pelvic exam and a Pap test every three years. For women ages 57-65, they may recommend the Pap test and pelvic exam, combined with testing for human papilloma virus (HPV), every five years. Some types of HPV increase your risk of cervical cancer. Testing for HPV may also be done on women of any age who have unclear Pap test results.  Other health care providers may not recommend any screening for nonpregnant women who are considered low risk for pelvic cancer and have no symptoms. Ask your health care provider if a screening pelvic exam is right for you.  If you have had past treatment for cervical cancer or a condition that could lead to cancer, you need Pap tests and screening for cancer for at least 20 years after your treatment. If Pap tests have been discontinued for you, your risk factors (such as having a new sexual partner) need to be reassessed to determine if you should start having screenings again. Some women have medical problems that increase the chance of getting cervical cancer. In these cases, your health care provider may recommend that you have screening and Pap tests more often.  If you have a family history of uterine cancer or ovarian cancer, talk with your health care provider about genetic screening.  If you have vaginal bleeding after reaching menopause, tell your health care provider.  There are currently no reliable tests available to screen for ovarian cancer.  Lung Cancer Lung cancer screening is recommended for adults 43-51 years old who are at high risk for lung cancer because of a history of smoking. A yearly low-dose CT scan of the lungs is recommended if you:  Currently smoke.  Have a history of at least 30 pack-years of smoking and you currently smoke or have quit within the past 15 years. A pack-year is smoking an average of one pack of cigarettes per day for one year.  Yearly  screening should:  Continue until it has been 15 years since you quit.  Stop if you develop a health problem that would prevent you from having lung cancer treatment.  Colorectal Cancer  This type of cancer can be detected and can often be prevented.  Routine colorectal cancer screening usually begins at age 28 and continues through age 63.  If you have risk factors for colon cancer, your health care provider may recommend that you be screened at an earlier age.  If you have a family history of colorectal cancer, talk with your health care provider about genetic screening.  Your health care provider may also recommend using home test kits to check for hidden blood in your stool.  A small camera at the end of a tube can be used to examine your colon directly (sigmoidoscopy or colonoscopy). This is done to check for the earliest forms of colorectal cancer.  Direct examination of the colon should be repeated every 5-10 years until age 33. However, if early forms of precancerous polyps or small growths are found or if you have a family history or genetic risk for colorectal cancer, you may need to be screened more often.  Skin Cancer  Check your skin from head to toe regularly.  Monitor any moles. Be sure to tell your health care provider: ? About any new moles or changes in moles, especially if there is a change in a mole's shape or color. ? If you have a mole that is larger than the size of a pencil eraser.  If any of your family members has a history of skin cancer, especially at a young age, talk with your health care provider about genetic screening.  Always use sunscreen. Apply sunscreen liberally and repeatedly throughout the day.  Whenever you are outside, protect yourself by wearing long sleeves, pants, a wide-brimmed hat, and sunglasses.  What should I know about osteoporosis? Osteoporosis is a condition in which bone destruction happens more quickly than new bone creation.  After menopause, you may be at an increased risk for osteoporosis. To help prevent osteoporosis or the bone fractures that can happen because of osteoporosis, the following is recommended:  If you are 20-62 years old, get at least 1,000 mg of calcium and at least 600 mg of vitamin D per day.  If you are older than age 68 but younger than age 40, get at least 1,200 mg of calcium and at least 600 mg of vitamin D per day.  If you are older than age 71, get at least 1,200 mg of calcium and at least 800 mg of vitamin D per day.  Smoking and excessive alcohol intake increase the risk of osteoporosis. Eat foods that are rich  in calcium and vitamin D, and do weight-bearing exercises several times each week as directed by your health care provider. What should I know about how menopause affects my mental health? Depression may occur at any age, but it is more common as you become older. Common symptoms of depression include:  Low or sad mood.  Changes in sleep patterns.  Changes in appetite or eating patterns.  Feeling an overall lack of motivation or enjoyment of activities that you previously enjoyed.  Frequent crying spells.  Talk with your health care provider if you think that you are experiencing depression. What should I know about immunizations? It is important that you get and maintain your immunizations. These include:  Tetanus, diphtheria, and pertussis (Tdap) booster vaccine.  Influenza every year before the flu season begins.  Pneumonia vaccine.  Shingles vaccine.  Your health care provider may also recommend other immunizations. This information is not intended to replace advice given to you by your health care provider. Make sure you discuss any questions you have with your health care provider. Document Released: 11/27/2005 Document Revised: 04/24/2016 Document Reviewed: 07/09/2015 Elsevier Interactive Patient Education  2018 [employer].

## 2018-07-04 NOTE — Progress Notes (Signed)
Subjective:   Sherri Allison is a 77 y.o. female who presents for Medicare Annual (Subsequent) preventive examination.  Review of Systems:  Comprehensive ROS is negative.   Cardiac Risk Factors include: advanced age (>33men, >45 women)     Objective:     Vitals: BP (!) 156/76   Pulse 74   Ht 5\' 2"  (1.575 m)   Wt 163 lb (73.9 kg)   SpO2 97%   BMI 29.81 kg/m   Body mass index is 29.81 kg/m.  Advanced Directives 07/04/2018 03/30/2017 01/14/2016 10/22/2014  Does Patient Have a Medical Advance Directive? No No No No  Would patient like information on creating a medical advance directive? No - Patient declined No - Patient declined No - patient declined information Yes - Scientist, clinical (histocompatibility and immunogenetics) given    Tobacco Social History   Tobacco Use  Smoking Status Never Smoker  Smokeless Tobacco Never Used     Counseling given: Not Answered   Clinical Intake:  Pre-visit preparation completed: Yes        Nutritional Status: BMI 25 -29 Overweight Diabetes: No     Interpreter Needed?: No  Physical Exam  Constitutional: She is oriented to person, place, and time. She appears well-developed and well-nourished.  HENT:  Head: Normocephalic and atraumatic.  Right Ear: External ear normal.  Left Ear: External ear normal.  Nose: Nose normal.  Mouth/Throat: Oropharynx is clear and moist.  TMs and canals are clear.   Eyes: Pupils are equal, round, and reactive to light. Conjunctivae and EOM are normal.  Neck: Neck supple. No thyromegaly present.  Cardiovascular: Normal rate, regular rhythm and normal heart sounds.  Pulmonary/Chest: Effort normal and breath sounds normal. She has no wheezes. Right breast exhibits no inverted nipple, no mass, no nipple discharge, no skin change and no tenderness. Left breast exhibits no inverted nipple, no mass, no nipple discharge, no skin change and no tenderness.  Lymphadenopathy:    She has no cervical adenopathy.  Neurological: She is alert and  oriented to person, place, and time.  Skin: Skin is warm and dry.  Psychiatric: She has a normal mood and affect.       Past Medical History:  Diagnosis Date  . Fibroids    Seen on ultrasound in 2006.  Marland Kitchen Normal cardiac stress test    2006   Past Surgical History:  Procedure Laterality Date  . ABDOMINAL HYSTERECTOMY    . APPENDECTOMY    . discketcomy    . INCONTINENCE SURGERY     Family History  Problem Relation Age of Onset  . Prostate cancer Brother   . Fibromyalgia Sister   . Pancreatic cancer Mother    Social History   Socioeconomic History  . Marital status: Married    Spouse name: Iona Beard  . Number of children: 2   . Years of education: Not on file  . Highest education level: Not on file  Occupational History  . Occupation: Retired Proofreader  . Financial resource strain: Not hard at all  . Food insecurity:    Worry: Never true    Inability: Never true  . Transportation needs:    Medical: No    Non-medical: No  Tobacco Use  . Smoking status: Never Smoker  . Smokeless tobacco: Never Used  Substance and Sexual Activity  . Alcohol use: No  . Drug use: No  . Sexual activity: Not on file  Lifestyle  . Physical activity:    Days per week: 7  days    Minutes per session: 50 min  . Stress: Not on file  Relationships  . Social connections:    Talks on phone: Not on file    Gets together: Not on file    Attends religious service: Not on file    Active member of club or organization: Not on file    Attends meetings of clubs or organizations: Not on file    Relationship status: Not on file  Other Topics Concern  . Not on file  Social History Narrative   Planning on joining gym with her sister to add swimming.      Outpatient Encounter Medications as of 07/04/2018  Medication Sig  . AMBULATORY NON FORMULARY MEDICATION Medication Name: Tdap 1x IM  . cholecalciferol (VITAMIN D) 1000 UNITS tablet Take 2,000 Units by mouth daily.   . MULTIPLE  VITAMINS PO Take 1 capsule by mouth daily.  . Multiple Vitamins-Minerals (PRESERVISION AREDS 2) CAPS Take 2 capsules by mouth daily.  . [DISCONTINUED] albuterol (PROVENTIL HFA;VENTOLIN HFA) 108 (90 Base) MCG/ACT inhaler Inhale 1-2 puffs into the lungs every 6 (six) hours as needed for wheezing or shortness of breath.  . [DISCONTINUED] aspirin 81 MG chewable tablet Chew 81 mg by mouth daily.  . [DISCONTINUED] azithromycin (ZITHROMAX) 250 MG tablet Take 1 tablet (250 mg total) by mouth daily. Take first 2 tablets together, then 1 every day until finished.  . [DISCONTINUED] benzonatate (TESSALON) 100 MG capsule Take 1-2 capsules (100-200 mg total) by mouth every 8 (eight) hours.  . [DISCONTINUED] Multiple Vitamins-Minerals (PRESERVISION AREDS 2 PO) Take 1 capsule by mouth daily.  . [DISCONTINUED] predniSONE (DELTASONE) 20 MG tablet 3 tabs po day one, then 2 po daily x 4 days   No facility-administered encounter medications on file as of 07/04/2018.     Activities of Daily Living In your present state of health, do you have any difficulty performing the following activities: 07/04/2018  Hearing? N  Vision? N  Difficulty concentrating or making decisions? N  Walking or climbing stairs? N  Dressing or bathing? N  Doing errands, shopping? N  Preparing Food and eating ? N  Using the Toilet? N  In the past six months, have you accidently leaked urine? N  Do you have problems with loss of bowel control? N  Managing your Medications? N  Managing your Finances? N  Housekeeping or managing your Housekeeping? N  Some recent data might be hidden    Patient Care Team: Hali Marry, MD as PCP - General (Family Medicine)    Assessment:   This is a routine wellness examination for Azlynn.  Exercise Activities and Dietary recommendations Current Exercise Habits: Home exercise routine, Type of exercise: Other - see comments(ellipitical and swimming), Frequency (Times/Week): 7, Intensity:  Moderate  Goals    . Patient Stated     Get tetanus and shingles vaccines up dated.        Fall Risk Fall Risk  07/04/2018 03/30/2017 01/14/2016 10/22/2014 09/21/2013  Falls in the past year? No No No No No    Depression Screen PHQ 2/9 Scores 07/04/2018 03/30/2017 01/14/2016 10/22/2014  PHQ - 2 Score 0 0 0 0     Cognitive Function     6CIT Screen 07/04/2018 03/30/2017  What Year? 0 points 0 points  What month? 0 points 0 points  What time? 0 points 0 points  Count back from 20 0 points 0 points  Months in reverse 0 points 2 points  Repeat phrase  0 points 2 points  Total Score 0 4    Immunization History  Administered Date(s) Administered  . Pneumococcal Conjugate-13 06/26/2015  . Pneumococcal Polysaccharide-23 11/23/2007  . Td 11/23/2007  . Zoster 07/23/2010    Qualifies for Shingles Vaccine? Yes  Screening Tests Health Maintenance  Topic Date Due  . INFLUENZA VACCINE  08/18/2018 (Originally 05/19/2018)  . TETANUS/TDAP  07/05/2019 (Originally 11/22/2017)  . DEXA SCAN  Completed  . PNA vac Low Risk Adult  Completed    Cancer Screenings: Lung: Low Dose CT Chest recommended if Age 107-80 years, 30 pack-year currently smoking OR have quit w/in 15years. Patient does not qualify. Breast:  Up to date on Mammogram? Yes   Up to date of Bone Density/Dexa? Yes Colorectal: she is > 16 yo so no longer needed.    Additional Screenings: Hepatitis C Screening:      Plan:   Medicare Wellness EXam   I have personally reviewed and noted the following in the patient's chart:   . Medical and social history . Use of alcohol, tobacco or illicit drugs  . Current medications and supplements . Functional ability and status . Nutritional status . Physical activity . Advanced directives . List of other physicians . Hospitalizations, surgeries, and ER visits in previous 12 months . Vitals . Screenings to include cognitive, depression, and falls . Referrals and appointments  In  addition, I have reviewed and discussed with patient certain preventive protocols, quality metrics, and best practice recommendations. A written personalized care plan for preventive services as well as general preventive health recommendations were provided to patient.     Beatrice Lecher, MD  07/04/2018

## 2018-07-05 LAB — COMPLETE METABOLIC PANEL WITH GFR
AG Ratio: 2 (calc) (ref 1.0–2.5)
ALT: 6 U/L (ref 6–29)
AST: 17 U/L (ref 10–35)
Albumin: 4.6 g/dL (ref 3.6–5.1)
Alkaline phosphatase (APISO): 55 U/L (ref 33–130)
BUN: 18 mg/dL (ref 7–25)
CALCIUM: 9.9 mg/dL (ref 8.6–10.4)
CO2: 27 mmol/L (ref 20–32)
Chloride: 108 mmol/L (ref 98–110)
Creat: 0.92 mg/dL (ref 0.60–0.93)
GFR, EST AFRICAN AMERICAN: 70 mL/min/{1.73_m2} (ref >=60)
GFR, EST NON AFRICAN AMERICAN: 60 mL/min/{1.73_m2} (ref >=60)
Globulin: 2.3 g/dL (calc) (ref 1.9–3.7)
Glucose, Bld: 97 mg/dL (ref 65–99)
Potassium: 5.1 mmol/L (ref 3.5–5.3)
SODIUM: 145 mmol/L (ref 135–146)
Total Bilirubin: 1.4 mg/dL — ABNORMAL HIGH (ref 0.2–1.2)
Total Protein: 6.9 g/dL (ref 6.1–8.1)

## 2018-07-05 LAB — VITAMIN D 25 HYDROXY (VIT D DEFICIENCY, FRACTURES): Vit D, 25-Hydroxy: 35 ng/mL (ref 30–100)

## 2018-07-05 LAB — LIPID PANEL
CHOL/HDL RATIO: 4.3 (calc) (ref <5.0)
Cholesterol: 232 mg/dL — ABNORMAL HIGH (ref <200)
HDL: 54 mg/dL (ref >50)
LDL Cholesterol (Calc): 144 mg/dL (calc) — ABNORMAL HIGH
NON-HDL CHOLESTEROL (CALC): 178 mg/dL — AB (ref <130)
TRIGLYCERIDES: 196 mg/dL — AB (ref <150)

## 2018-07-05 LAB — MICROALBUMIN / CREATININE URINE RATIO
Creatinine, Urine: 153 mg/dL (ref 20–275)
MICROALB UR: 1.3 mg/dL
Microalb Creat Ratio: 8 mcg/mg creat (ref <30)

## 2018-07-05 LAB — TSH: TSH: 1.35 mIU/L (ref 0.40–4.50)

## 2018-07-08 ENCOUNTER — Ambulatory Visit (INDEPENDENT_AMBULATORY_CARE_PROVIDER_SITE_OTHER): Payer: Medicare Other

## 2018-07-08 DIAGNOSIS — Z1231 Encounter for screening mammogram for malignant neoplasm of breast: Secondary | ICD-10-CM

## 2018-07-08 DIAGNOSIS — Z1239 Encounter for other screening for malignant neoplasm of breast: Secondary | ICD-10-CM

## 2018-07-11 ENCOUNTER — Encounter: Payer: Self-pay | Admitting: Family Medicine

## 2018-07-12 ENCOUNTER — Emergency Department
Admission: EM | Admit: 2018-07-12 | Discharge: 2018-07-12 | Disposition: A | Discharge status: Home / Self Care | Payer: Medicare Other | Source: Home / Self Care | Attending: Family Medicine | Admitting: Family Medicine

## 2018-07-12 ENCOUNTER — Encounter: Payer: Self-pay | Admitting: *Deleted

## 2018-07-12 ENCOUNTER — Other Ambulatory Visit: Payer: Self-pay

## 2018-07-12 DIAGNOSIS — R300 Dysuria: Secondary | ICD-10-CM | POA: Diagnosis not present

## 2018-07-12 DIAGNOSIS — N10 Acute tubulo-interstitial nephritis: Secondary | ICD-10-CM

## 2018-07-12 LAB — POCT CBC W AUTO DIFF (K'VILLE URGENT CARE)

## 2018-07-12 LAB — POCT URINALYSIS DIP (MANUAL ENTRY)
Glucose, UA: NEGATIVE mg/dL
Ketones, POC UA: NEGATIVE mg/dL
NITRITE UA: POSITIVE — AB
PH UA: 5.5 (ref 5.0–8.0)
Protein Ur, POC: 100 mg/dL — AB
Spec Grav, UA: 1.015 (ref 1.010–1.025)
UROBILINOGEN UA: 1 U/dL

## 2018-07-12 MED ORDER — CEFTRIAXONE SODIUM 1 G IJ SOLR
1000.0000 mg | Freq: Once | INTRAMUSCULAR | Status: AC
  Administered 2018-07-12: 1000 mg via INTRAMUSCULAR

## 2018-07-12 MED ORDER — ONDANSETRON 4 MG PO TBDP: ORAL_TABLET | ORAL | 0 refills | Status: DC

## 2018-07-12 MED ORDER — SULFAMETHOXAZOLE-TRIMETHOPRIM 800-160 MG PO TABS: 1.0000 | ORAL_TABLET | Freq: Two times a day (BID) | ORAL | 0 refills | Status: DC

## 2018-07-12 NOTE — ED Provider Notes (Signed)
[facility]    CSN: [id number] Arrival date & time: 07/12/18  1009     History   Chief Complaint Chief Complaint  Patient presents with  . Chills  . Dysuria    HPI Sherri Allison is a 77 y.o. female.   Patient noticed mild dysuria about 6 days ago.  This was followed the next day by chills, myalgias, low grade fever, nausea, mild dizziness, and fatigue.  During the past five days she has had right flank and back ache.  This morning she had fever to 101.  The history is provided by the patient.  Dysuria  Pain quality:  Burning Pain severity:  Mild Onset quality:  Sudden Duration:  6 days Timing:  Constant Progression:  Improving Chronicity:  New Recent urinary tract infections: no   Relieved by:  None tried Worsened by:  Nothing Ineffective treatments:  None tried Urinary symptoms: frequent urination and hesitancy   Urinary symptoms: no discolored urine, no foul-smelling urine, no hematuria and no bladder incontinence   Associated symptoms: fever, flank pain and nausea   Associated symptoms: no abdominal pain, no genital lesions, no vaginal discharge and no vomiting   Risk factors: no hx of urolithiasis and no recurrent urinary tract infections     Past Medical History:  Diagnosis Date  . Fibroids    Seen on ultrasound in 2006.  Marland Kitchen Normal cardiac stress test    2006    Patient Active Problem List   Diagnosis Date Noted  . Combined forms of age-related cataract of right eye 11/16/2017  . CKD (chronic kidney disease) stage 3, GFR 30-59 ml/min (HCC) 01/16/2016  . Hyperlipidemia 08/01/2010  . UNSPECIFIED VITAMIN D DEFICIENCY 07/23/2010  . SCIATICA 05/11/2009  . BACK PAIN, LUMBAR, WITH RADICULOPATHY 02/14/2008  . UTERINE PROLAPSE 11/23/2007  . Osteoporosis 11/02/2007  . OVERACTIVE BLADDER 11/01/2007    Past Surgical History:  Procedure Laterality Date  . ABDOMINAL HYSTERECTOMY    . APPENDECTOMY    . BACK SURGERY    . discketcomy    .  INCONTINENCE SURGERY      OB History   None      Home Medications    Prior to Admission medications   Medication Sig Start Date End Date Taking? Authorizing Provider  AMBULATORY NON FORMULARY MEDICATION Medication Name: Tdap 1x IM 07/04/18   Hali Marry, MD  cholecalciferol (VITAMIN D) 1000 UNITS tablet Take 2,000 Units by mouth daily.     [provider]  MULTIPLE VITAMINS PO Take 1 capsule by mouth daily.    [provider]  Multiple Vitamins-Minerals (PRESERVISION AREDS 2) CAPS Take 2 capsules by mouth daily.    [provider]  ondansetron (ZOFRAN ODT) 4 MG disintegrating tablet Take one tab by mouth Q6hr prn nausea.  Dissolve under tongue. 07/12/18   Kandra Nicolas, MD  sulfamethoxazole-trimethoprim (BACTRIM DS,SEPTRA DS) 800-160 MG tablet Take 1 tablet by mouth 2 (two) times daily. 07/12/18   Kandra Nicolas, MD    Family History Family History  Problem Relation Age of Onset  . Prostate cancer Brother   . Fibromyalgia Sister   . Pancreatic cancer Mother     Social History Social History   Tobacco Use  . Smoking status: Never Smoker  . Smokeless tobacco: Never Used  Substance Use Topics  . Alcohol use: No  . Drug use: No     Allergies   Patient has no known allergies.   Review of Systems Review  of Systems  Constitutional: Positive for appetite change, chills, diaphoresis, fatigue and fever.  HENT: Negative.   Eyes: Negative.   Respiratory: Negative.   Cardiovascular: Negative.   Gastrointestinal: Positive for nausea. Negative for abdominal pain and vomiting.  Genitourinary: Positive for dysuria, flank pain, frequency and urgency. Negative for decreased urine volume, hematuria, pelvic pain and vaginal discharge.  Musculoskeletal: Positive for myalgias.  Skin: Negative.   Neurological: Positive for dizziness.     Physical Exam Triage Vital Signs ED Triage Vitals  Enc Vitals Group     BP 07/12/18 1045 (!) 132/91      Pulse Rate 07/12/18 1045 (!) 107     Resp 07/12/18 1045 18     Temp 07/12/18 1045 98.8 F (37.1 C)     Temp Source 07/12/18 1045 Oral     SpO2 07/12/18 1045 96 %     Weight 07/12/18 1046 160 lb (72.6 kg)     Height 07/12/18 1046 5\' 2"  (1.575 m)     Head Circumference --      Peak Flow --      Pain Score 07/12/18 1046 0     Pain Loc --      Pain Edu? --      Excl. in [place]? --    No data found.  Updated Vital Signs BP (!) 132/91 (BP Location: Right Arm)   Pulse (!) 107   Temp 98.8 F (37.1 C) (Oral)   Resp 18   Ht 5\' 2"  (1.575 m)   Wt 72.6 kg   SpO2 96%   BMI 29.26 kg/m   Visual Acuity Right Eye Distance:   Left Eye Distance:   Bilateral Distance:    Right Eye Near:   Left Eye Near:    Bilateral Near:     Physical Exam Nursing notes and Vital Signs reviewed. Appearance:  Patient appears stated age, and in no acute distress.    Eyes:  Pupils are equal, round, and reactive to light and accomodation.  Extraocular movement is intact.  Conjunctivae are not inflamed   Pharynx:  Normal; moist mucous membranes  Neck:  Supple.  No adenopathy Lungs:  Clear to auscultation.  Breath sounds are equal.  Moving air well. Heart:  Regular rate and rhythm without murmurs, rubs, or gallops.  Abdomen:  Vague tenderness over bladder without masses or hepatosplenomegaly.  Bowel sounds are present.  Right flank tenderness is present..  Extremities:  No edema.  Skin:  No rash present.     UC Treatments / Results  Labs (all labs ordered are listed, but only abnormal results are displayed) Labs Reviewed  POCT URINALYSIS DIP (MANUAL ENTRY) - Abnormal; Notable for the following components:      Result Value   Color, UA orange (*)    Clarity, UA cloudy (*)    Bilirubin, UA small (*)    Blood, UA moderate (*)    Protein Ur, POC =100 (*)    Nitrite, UA Positive (*)    Leukocytes, UA Large (3+) (*)    All other components within normal limits  URINE CULTURE  POCT CBC W AUTO DIFF  ([facility]):  WBC 17.1; LY 12.8; MO 2.2; GR 85.0; Hgb 14.5; Platelets     EKG None  Radiology No results found.  Procedures Procedures (including critical care time)  Medications Ordered in UC Medications  cefTRIAXone (ROCEPHIN) injection 1,000 mg (has no administration in time range)    Initial Impression / Assessment and Plan /  UC Course  I have reviewed the triage vital signs and the nursing notes.  Pertinent labs & imaging results that were available during my care of the patient were reviewed by me and considered in my medical decision making (see chart for details).    Note leukocytosis (WBC 17.1) Urine culture pending.  Administered Rocephin 1gm IM. Because of potential risks of fluoroquinolones, will begin Bactrim DS, one BID for 10 days. Rx for Zofran ODT 4mg . Patient will need close follow-up.  Followup with Dr. Madilyn Fireman in 48 hours.   Final Clinical Impressions(s) / UC Diagnoses   Final diagnoses:  Dysuria  Acute pyelonephritis     Discharge Instructions     Rest.  Increase fluid intake.  May take Tylenol as needed for fever, pain, etc. If symptoms become significantly worse during the night or over the weekend, proceed to the local emergency room.     ED Prescriptions    Medication Sig Dispense Auth. Provider   sulfamethoxazole-trimethoprim (BACTRIM DS,SEPTRA DS) 800-160 MG tablet Take 1 tablet by mouth 2 (two) times daily. 20 tablet Kandra Nicolas, MD   ondansetron (ZOFRAN ODT) 4 MG disintegrating tablet Take one tab by mouth Q6hr prn nausea.  Dissolve under tongue. 12 tablet Kandra Nicolas, MD         Kandra Nicolas, MD 07/12/18 1250

## 2018-07-12 NOTE — ED Triage Notes (Addendum)
Pt c/o chills, nausea, dizziness, dysuria and body aches x 3 days. Temp 100.0 this morning at home. Denies cough. She reports receiving her flu and Tetanus vaccines 6 days ago. Reports last dose Urostat 2 days ago.

## 2018-07-12 NOTE — Discharge Instructions (Addendum)
Rest.  Increase fluid intake.  May take Tylenol as needed for fever, pain, etc. If symptoms become significantly worse during the night or over the weekend, proceed to the local emergency room.

## 2018-07-14 ENCOUNTER — Ambulatory Visit (INDEPENDENT_AMBULATORY_CARE_PROVIDER_SITE_OTHER): Payer: Medicare Other | Admitting: Family Medicine

## 2018-07-14 ENCOUNTER — Telehealth: Payer: Self-pay

## 2018-07-14 ENCOUNTER — Encounter: Payer: Self-pay | Admitting: Family Medicine

## 2018-07-14 VITALS — BP 123/67 | HR 95 | Ht 62.0 in | Wt 160.0 lb

## 2018-07-14 DIAGNOSIS — R82998 Other abnormal findings in urine: Secondary | ICD-10-CM | POA: Diagnosis not present

## 2018-07-14 DIAGNOSIS — N10 Acute tubulo-interstitial nephritis: Secondary | ICD-10-CM

## 2018-07-14 LAB — URINE CULTURE
MICRO NUMBER:: 91146172
SPECIMEN QUALITY:: ADEQUATE

## 2018-07-14 LAB — POCT URINALYSIS DIPSTICK
BILIRUBIN UA: NEGATIVE
Glucose, UA: NEGATIVE
Ketones, UA: NEGATIVE
Nitrite, UA: NEGATIVE
PH UA: 6 (ref 5.0–8.0)
Protein, UA: NEGATIVE
Spec Grav, UA: 1.015 (ref 1.010–1.025)
UROBILINOGEN UA: 2 U/dL — AB

## 2018-07-14 NOTE — Progress Notes (Signed)
Subjective:    Patient ID: Sherri Allison, female    DOB: June 16, 1941, 78 y.o.   MRN: [id number]  HPI 77 year old female is here today to follow-up for cute pyelonephritis-she has had dysuria for about 6 days and then suddenly developed low-grade fever nausea and myalgias.  She went to urgent care on September 24.  She was noticed to have an elevated white blood cell count of 17.  She was given Rocephin 1 g IM and started on Bactrim DS twice a day for 10 days.  She is also given some Zofran for nausea.  So far her urine culture has grown out gram-negative bacilli but still waiting for the final culture and sensitivities.  She is actually feeling much better.  She has not had a fever since Sunday.  She is no longer needing the nausea medication.  She still has a little bit of discomfort in that right lower quadrant but overall is feeling much better.  She did want to remind me that she does have a bladder mesh implant in place and wondered if that could be increasing her risk for urinary tract infections.   Review of Systems BP 123/67   Pulse 95   Ht 5\' 2"  (1.575 m)   Wt 160 lb (72.6 kg)   SpO2 96%   BMI 29.26 kg/m     No Known Allergies  Past Medical History:  Diagnosis Date  . Fibroids    Seen on ultrasound in 2006.  Marland Kitchen Normal cardiac stress test    2006    Past Surgical History:  Procedure Laterality Date  . ABDOMINAL HYSTERECTOMY    . APPENDECTOMY    . BACK SURGERY    . discketcomy    . INCONTINENCE SURGERY      Social History   Socioeconomic History  . Marital status: Married    Spouse name: Iona Beard  . Number of children: 2   . Years of education: Not on file  . Highest education level: Not on file  Occupational History  . Occupation: Retired Proofreader  . Financial resource strain: Not hard at all  . Food insecurity:    Worry: Never true    Inability: Never true  . Transportation needs:    Medical: No    Non-medical: No  Tobacco Use  . Smoking status:  Never Smoker  . Smokeless tobacco: Never Used  Substance and Sexual Activity  . Alcohol use: No  . Drug use: No  . Sexual activity: Not on file  Lifestyle  . Physical activity:    Days per week: 7 days    Minutes per session: 50 min  . Stress: Not on file  Relationships  . Social connections:    Talks on phone: Not on file    Gets together: Not on file    Attends religious service: Not on file    Active member of club or organization: Not on file    Attends meetings of clubs or organizations: Not on file    Relationship status: Not on file  . Intimate partner violence:    Fear of current or ex partner: Not on file    Emotionally abused: Not on file    Physically abused: Not on file    Forced sexual activity: Not on file  Other Topics Concern  . Not on file  Social History Narrative   Planning on joining gym with her sister to add swimming.      Family History  Problem Relation Age of Onset  . Prostate cancer Brother   . Fibromyalgia Sister   . Pancreatic cancer Mother     Outpatient Encounter Medications as of 07/14/2018  Medication Sig  . cholecalciferol (VITAMIN D) 1000 UNITS tablet Take 2,000 Units by mouth daily.   . MULTIPLE VITAMINS PO Take 1 capsule by mouth daily.  . Multiple Vitamins-Minerals (PRESERVISION AREDS 2) CAPS Take 2 capsules by mouth daily.  . ondansetron (ZOFRAN ODT) 4 MG disintegrating tablet Take one tab by mouth Q6hr prn nausea.  Dissolve under tongue.  . sulfamethoxazole-trimethoprim (BACTRIM DS,SEPTRA DS) 800-160 MG tablet Take 1 tablet by mouth 2 (two) times daily.  . [DISCONTINUED] AMBULATORY NON FORMULARY MEDICATION Medication Name: Tdap 1x IM   No facility-administered encounter medications on file as of 07/14/2018.          Objective:   Physical Exam  Constitutional: She is oriented to person, place, and time. She appears well-developed and well-nourished.  HENT:  Head: Normocephalic and atraumatic.  Eyes: Conjunctivae and EOM are  normal.  Cardiovascular: Normal rate.  Pulmonary/Chest: Effort normal.  Abdominal: Soft. Bowel sounds are normal. She exhibits no distension and no mass. There is tenderness. There is no rebound and no guarding. No hernia.  Mild tenderness in the RLQ  Musculoskeletal:  No CVA tenderness  Neurological: She is alert and oriented to person, place, and time.  Skin: Skin is dry. No pallor.  Psychiatric: She has a normal mood and affect. Her behavior is normal.  Vitals reviewed.      Assessment & Plan:  Acute pyelonephritis-she is feeling significantly better.  Still just a little bit tender on exam of the right lower quadrant.  We discussed potential causes for urinary tract infections especially in postmenopausal women.  She does have a bladder mesh implant which certainly if we start to notice more frequent infections and we may need to consider having urology check this and make sure they do not feel like it is increasing her risk for causing any problems.  Did encourage her to complete her antibiotics.  The culture is still pending.  It is growing out gram-negative bacteria but no species or sensitivities back yet.  Discussed with her that we would call if we need to change her antibiotics. drink plenty of water

## 2018-07-14 NOTE — Telephone Encounter (Signed)
Pt notified of [place] results. Advised to complete entire course and call if any questions.

## 2018-08-03 DIAGNOSIS — H353213 Exudative age-related macular degeneration, right eye, with inactive scar: Secondary | ICD-10-CM | POA: Diagnosis not present

## 2018-08-03 DIAGNOSIS — H43813 Vitreous degeneration, bilateral: Secondary | ICD-10-CM | POA: Diagnosis not present

## 2018-08-03 DIAGNOSIS — H2511 Age-related nuclear cataract, right eye: Secondary | ICD-10-CM | POA: Diagnosis not present

## 2018-08-03 DIAGNOSIS — H353123 Nonexudative age-related macular degeneration, left eye, advanced atrophic without subfoveal involvement: Secondary | ICD-10-CM | POA: Diagnosis not present

## 2018-12-19 DIAGNOSIS — H52223 Regular astigmatism, bilateral: Secondary | ICD-10-CM | POA: Diagnosis not present

## 2018-12-19 DIAGNOSIS — H527 Unspecified disorder of refraction: Secondary | ICD-10-CM | POA: Diagnosis not present

## 2018-12-19 DIAGNOSIS — H25811 Combined forms of age-related cataract, right eye: Secondary | ICD-10-CM | POA: Diagnosis not present

## 2018-12-19 DIAGNOSIS — H353124 Nonexudative age-related macular degeneration, left eye, advanced atrophic with subfoveal involvement: Secondary | ICD-10-CM | POA: Diagnosis not present

## 2018-12-19 DIAGNOSIS — H353212 Exudative age-related macular degeneration, right eye, with inactive choroidal neovascularization: Secondary | ICD-10-CM | POA: Diagnosis not present

## 2019-05-02 DIAGNOSIS — H43813 Vitreous degeneration, bilateral: Secondary | ICD-10-CM | POA: Diagnosis not present

## 2019-05-02 DIAGNOSIS — H353133 Nonexudative age-related macular degeneration, bilateral, advanced atrophic without subfoveal involvement: Secondary | ICD-10-CM | POA: Diagnosis not present

## 2019-05-02 DIAGNOSIS — H2511 Age-related nuclear cataract, right eye: Secondary | ICD-10-CM | POA: Diagnosis not present

## 2019-08-16 ENCOUNTER — Emergency Department
Admission: EM | Admit: 2019-08-16 | Discharge: 2019-08-16 | Disposition: A | Discharge status: Home / Self Care | Payer: Medicare Other | Source: Home / Self Care

## 2019-08-16 ENCOUNTER — Other Ambulatory Visit: Payer: Self-pay

## 2019-08-16 DIAGNOSIS — R232 Flushing: Secondary | ICD-10-CM | POA: Diagnosis not present

## 2019-08-16 DIAGNOSIS — R42 Dizziness and giddiness: Secondary | ICD-10-CM | POA: Diagnosis not present

## 2019-08-16 DIAGNOSIS — R110 Nausea: Secondary | ICD-10-CM | POA: Diagnosis not present

## 2019-08-16 DIAGNOSIS — R9431 Abnormal electrocardiogram [ECG] [EKG]: Secondary | ICD-10-CM | POA: Diagnosis not present

## 2019-08-16 DIAGNOSIS — Z79899 Other long term (current) drug therapy: Secondary | ICD-10-CM | POA: Diagnosis not present

## 2019-08-16 HISTORY — DX: Unspecified macular degeneration: H35.30

## 2019-08-16 MED ORDER — ASPIRIN 81 MG PO CHEW
324.0000 mg | CHEWABLE_TABLET | Freq: Once | ORAL | Status: AC
  Administered 2019-08-16: 324 mg via ORAL

## 2019-08-16 MED ORDER — ONDANSETRON 4 MG PO TBDP
4.0000 mg | ORAL_TABLET | Freq: Once | ORAL | Status: AC
  Administered 2019-08-16: 4 mg via ORAL

## 2019-08-16 NOTE — Discharge Instructions (Signed)
°  You have declined EMS transport but it is still recommended you have your husband drive you to [facility] Emergency Department for further evaluation and treatment of symptoms.

## 2019-08-16 NOTE — ED Provider Notes (Signed)
[facility]ri [facility]    CSN: [id number] Arrival date & time: 08/16/19  1438      History   Chief Complaint Chief Complaint  Patient presents with  . Dizziness  . Nausea    HPI Sherri Allison is a 78 y.o. female.   HPI  Sherri Allison is a 78 y.o. female presenting to UC with c/o intermittent episodes of dizziness/lightheadedness, nausea, and flushing sensation over her body the last few days.  She had about 2 episodes last week but more frequent last night and 4-5 episodes today including one during triage. Pt denies chest pain, SOB, or palpitations. Denies HA.  No cough, congestion or sore throat.  No hx of vertigo. No recent head injuries. Denies weakness or numbness in arms or legs.   Past Medical History:  Diagnosis Date  . Fibroids    Seen on ultrasound in 2006.  . Macular degeneration   . Normal cardiac stress test    2006    Patient Active Problem List   Diagnosis Date Noted  . Combined forms of age-related cataract of right eye 11/16/2017  . CKD (chronic kidney disease) stage 3, GFR 30-59 ml/min 01/16/2016  . Hyperlipidemia 08/01/2010  . UNSPECIFIED VITAMIN D DEFICIENCY 07/23/2010  . SCIATICA 05/11/2009  . BACK PAIN, LUMBAR, WITH RADICULOPATHY 02/14/2008  . UTERINE PROLAPSE 11/23/2007  . Osteoporosis 11/02/2007  . OVERACTIVE BLADDER 11/01/2007    Past Surgical History:  Procedure Laterality Date  . ABDOMINAL HYSTERECTOMY    . APPENDECTOMY    . BACK SURGERY    . discketcomy    . INCONTINENCE SURGERY      OB History   No obstetric history on file.      Home Medications    Prior to Admission medications   Medication Sig Start Date End Date Taking? Authorizing Provider  cholecalciferol (VITAMIN D) 1000 UNITS tablet Take 2,000 Units by mouth daily.     [provider]  MULTIPLE VITAMINS PO Take 1 capsule by mouth daily.    [provider]  Multiple Vitamins-Minerals (PRESERVISION AREDS 2) CAPS Take 2 capsules by mouth daily.     [provider]  ondansetron (ZOFRAN ODT) 4 MG disintegrating tablet Take one tab by mouth Q6hr prn nausea.  Dissolve under tongue. 07/12/18   Kandra Nicolas, MD  sulfamethoxazole-trimethoprim (BACTRIM DS,SEPTRA DS) 800-160 MG tablet Take 1 tablet by mouth 2 (two) times daily. 07/12/18   Kandra Nicolas, MD    Family History Family History  Problem Relation Age of Onset  . Prostate cancer Brother   . Fibromyalgia Sister   . Pancreatic cancer Mother     Social History Social History   Tobacco Use  . Smoking status: Never Smoker  . Smokeless tobacco: Never Used  Substance Use Topics  . Alcohol use: No  . Drug use: No     Allergies   Patient has no known allergies.   Review of Systems Review of Systems  Constitutional: Positive for fever (subjective/hot/flush but just for a few seconds at a time). Negative for chills.  HENT: Negative for congestion, ear pain, sore throat, trouble swallowing and voice change.   Eyes: Negative for photophobia and visual disturbance.  Respiratory: Negative for cough and shortness of breath.   Cardiovascular: Negative for chest pain and palpitations.  Gastrointestinal: Positive for nausea. Negative for abdominal pain, diarrhea and vomiting.  Musculoskeletal: Negative for arthralgias, back pain and myalgias.  Skin: Negative for rash.  Neurological: Positive for dizziness  and light-headedness. Negative for tremors, syncope, facial asymmetry, speech difficulty, weakness, numbness and headaches.     Physical Exam Triage Vital Signs ED Triage Vitals  Enc Vitals Group     BP 08/16/19 1456 133/81     Pulse Rate 08/16/19 1456 89     Resp 08/16/19 1456 20     Temp 08/16/19 1456 98.6 F (37 C)     Temp Source 08/16/19 1456 Oral     SpO2 08/16/19 1456 94 %     Weight 08/16/19 1457 168 lb (76.2 kg)     Height 08/16/19 1457 5\' 3"  (1.6 m)     Head Circumference --      Peak Flow --      Pain Score 08/16/19 1457 0     Pain Loc --       Pain Edu? --      Excl. in [place]? --    No data found.  Updated Vital Signs BP 133/81 (BP Location: Right Arm)   Pulse 89   Temp 98.6 F (37 C) (Oral)   Resp 20   Ht 5\' 3"  (1.6 m)   Wt 168 lb (76.2 kg)   SpO2 94%   BMI 29.76 kg/m   Visual Acuity Right Eye Distance:   Left Eye Distance:   Bilateral Distance:    Right Eye Near:   Left Eye Near:    Bilateral Near:     Physical Exam Vitals signs and nursing note reviewed.  Constitutional:      General: She is not in acute distress.    Appearance: Normal appearance. She is well-developed. She is not ill-appearing, toxic-appearing or diaphoretic.  HENT:     Head: Normocephalic and atraumatic.     Right Ear: Tympanic membrane and ear canal normal.     Left Ear: Tympanic membrane and ear canal normal.     Nose: Nose normal.     Mouth/Throat:     Mouth: Mucous membranes are moist.     Pharynx: Oropharynx is clear.  Eyes:     Extraocular Movements: Extraocular movements intact.     Conjunctiva/sclera: Conjunctivae normal.     Pupils: Pupils are equal, round, and reactive to light.  Neck:     Musculoskeletal: Normal range of motion and neck supple.  Cardiovascular:     Rate and Rhythm: Normal rate and regular rhythm.  Pulmonary:     Effort: Pulmonary effort is normal. No respiratory distress.     Breath sounds: Normal breath sounds. No stridor. No wheezing, rhonchi or rales.  Musculoskeletal: Normal range of motion.  Skin:    General: Skin is warm and dry.     Capillary Refill: Capillary refill takes less than 2 seconds.  Neurological:     General: No focal deficit present.     Mental Status: She is alert and oriented to person, place, and time.     Cranial Nerves: No cranial nerve deficit.     Sensory: No sensory deficit.     Motor: No weakness.     Coordination: Coordination normal.     Gait: Gait normal.  Psychiatric:        Mood and Affect: Mood normal.        Behavior: Behavior normal.      UC Treatments /  Results  Labs (all labs ordered are listed, but only abnormal results are displayed) Labs Reviewed - No data to display  EKG Date/Time:08/16/2019   15:09:39 Ventricular Rate: 76 PR Interval: 166 QRS  Duration: 86 QT Interval: 394 QTC Calculation: 443 P-R-T axes:  43   -29   1 Text Interpretation: Normal sinus rhythm.  Cannot rule out inferior infarct, age undetermined. Abnormal EKG.  Last visible EKG was in 2011, showed Normal Sinus Rhythm, normal EKG.  There is change since last EKG    Radiology No results found.  Procedures Procedures (including critical care time)  Medications Ordered in UC Medications  aspirin chewable tablet 324 mg (324 mg Oral Given 08/16/19 1522)  ondansetron (ZOFRAN-ODT) disintegrating tablet 4 mg (4 mg Oral Given 08/16/19 1522)    Initial Impression / Assessment and Plan / UC Course  I have reviewed the triage vital signs and the nursing notes.  Pertinent labs & imaging results that were available during my care of the patient were reviewed by me and considered in my medical decision making (see chart for details).    Recommend pt have further evaluation in emergency department. Pt is accompanied by her husband. Declined EMS transport but is agreeable to have him drive her to [facility] for further evaluation and treatment.  Final Clinical Impressions(s) / UC Diagnoses   Final diagnoses:  Nausea without vomiting  Dizziness  Episodic lightheadedness  Abnormal EKG  Flush     Discharge Instructions      You have declined EMS transport but it is still recommended you have your husband drive you to [facility] Emergency Department for further evaluation and treatment of symptoms.     ED Prescriptions    None     PDMP not reviewed this encounter.   Noe Gens, [place] 08/16/19 [phone]

## 2019-08-16 NOTE — ED Triage Notes (Signed)
Pt has indigestion, then nausea.  After the nausea, gets very dizzy, then flushed over entire body.  Happened twice last week, a couple times last night and 4-5 times today

## 2019-10-31 DIAGNOSIS — H43813 Vitreous degeneration, bilateral: Secondary | ICD-10-CM | POA: Diagnosis not present

## 2019-10-31 DIAGNOSIS — H353213 Exudative age-related macular degeneration, right eye, with inactive scar: Secondary | ICD-10-CM | POA: Diagnosis not present

## 2019-10-31 DIAGNOSIS — H353123 Nonexudative age-related macular degeneration, left eye, advanced atrophic without subfoveal involvement: Secondary | ICD-10-CM | POA: Diagnosis not present

## 2020-04-30 ENCOUNTER — Other Ambulatory Visit: Payer: Self-pay | Admitting: Ophthalmology

## 2020-04-30 DIAGNOSIS — H2513 Age-related nuclear cataract, bilateral: Secondary | ICD-10-CM | POA: Diagnosis not present

## 2020-04-30 DIAGNOSIS — H353123 Nonexudative age-related macular degeneration, left eye, advanced atrophic without subfoveal involvement: Secondary | ICD-10-CM | POA: Diagnosis not present

## 2020-04-30 DIAGNOSIS — H353213 Exudative age-related macular degeneration, right eye, with inactive scar: Secondary | ICD-10-CM | POA: Diagnosis not present

## 2020-04-30 DIAGNOSIS — H43813 Vitreous degeneration, bilateral: Secondary | ICD-10-CM | POA: Diagnosis not present

## 2020-04-30 DIAGNOSIS — H5319 Other subjective visual disturbances: Secondary | ICD-10-CM

## 2020-05-13 ENCOUNTER — Ambulatory Visit (INDEPENDENT_AMBULATORY_CARE_PROVIDER_SITE_OTHER): Payer: Medicare Other

## 2020-05-13 ENCOUNTER — Other Ambulatory Visit: Payer: Self-pay

## 2020-05-13 DIAGNOSIS — H5319 Other subjective visual disturbances: Secondary | ICD-10-CM

## 2020-05-13 MED ORDER — GADOBUTROL 1 MMOL/ML IV SOLN
7.5000 mL | Freq: Once | INTRAVENOUS | Status: AC | PRN
  Administered 2020-05-13: 7.5 mL via INTRAVENOUS

## 2020-06-07 DIAGNOSIS — H353213 Exudative age-related macular degeneration, right eye, with inactive scar: Secondary | ICD-10-CM | POA: Diagnosis not present

## 2020-06-07 DIAGNOSIS — H2511 Age-related nuclear cataract, right eye: Secondary | ICD-10-CM | POA: Diagnosis not present

## 2020-06-07 DIAGNOSIS — H353123 Nonexudative age-related macular degeneration, left eye, advanced atrophic without subfoveal involvement: Secondary | ICD-10-CM | POA: Diagnosis not present

## 2020-06-07 DIAGNOSIS — H43813 Vitreous degeneration, bilateral: Secondary | ICD-10-CM | POA: Diagnosis not present

## 2020-06-11 ENCOUNTER — Telehealth: Payer: Self-pay

## 2020-06-11 NOTE — Telephone Encounter (Signed)
Agree with documentation as above.   Vaunda Gutterman, MD  

## 2020-06-11 NOTE — Telephone Encounter (Signed)
Sherri Allison scheduled for a follow up MRI next week. Patient advised to start an 81 mg ASA, per Dr Madilyn Fireman.

## 2020-06-20 ENCOUNTER — Other Ambulatory Visit: Payer: Self-pay

## 2020-06-20 ENCOUNTER — Encounter: Payer: Self-pay | Admitting: Family Medicine

## 2020-06-20 ENCOUNTER — Ambulatory Visit (INDEPENDENT_AMBULATORY_CARE_PROVIDER_SITE_OTHER): Payer: Medicare Other | Admitting: Family Medicine

## 2020-06-20 VITALS — BP 125/69 | HR 87 | Ht 62.0 in | Wt 161.0 lb

## 2020-06-20 DIAGNOSIS — Z8673 Personal history of transient ischemic attack (TIA), and cerebral infarction without residual deficits: Secondary | ICD-10-CM

## 2020-06-20 DIAGNOSIS — N1831 Chronic kidney disease, stage 3a: Secondary | ICD-10-CM

## 2020-06-20 DIAGNOSIS — R42 Dizziness and giddiness: Secondary | ICD-10-CM | POA: Diagnosis not present

## 2020-06-20 DIAGNOSIS — I6381 Other cerebral infarction due to occlusion or stenosis of small artery: Secondary | ICD-10-CM | POA: Diagnosis not present

## 2020-06-20 DIAGNOSIS — E785 Hyperlipidemia, unspecified: Secondary | ICD-10-CM

## 2020-06-20 MED ORDER — ATORVASTATIN CALCIUM 20 MG PO TABS: 20.0000 mg | ORAL_TABLET | Freq: Every day | ORAL | 3 refills | Status: DC

## 2020-06-20 NOTE — Assessment & Plan Note (Signed)
Well overdue to recheck renal function we have not had up-to-date labs on her numbers 2 years which was about the last time I saw her.

## 2020-06-20 NOTE — Assessment & Plan Note (Signed)
To update lipids.  We did discuss going ahead and starting a statin as below.

## 2020-06-20 NOTE — Progress Notes (Signed)
Established Patient Office Visit  Subjective:  Patient ID: Sherri Allison, female    DOB: [birth year]  Age: 79 y.o. MRN: [id number]  CC:  Chief Complaint  Patient presents with  . Results    HPI Sherri Allison presents for follow-up of recent findings on brain MRI.  This was ordered by her eye doctor Dr. Jule Ser on July 26.  He reached out to our office and I spoke with him directly on August 24, almost 4 weeks later, regarding the results indicating possible subacute lacunar infarct in the right corona radiata.  She reports that she does not member having any symptoms of weakness or mental status change.  She says no one around her has commented about any changes that they have noticed.  He does report that she has been having some episodic lightheadedness episodes that started last October.  She says she will have occasional episodes of suddenly feeling lightheaded like she is going to pass out she denies actual room spinning she says she will start to feel nauseated with it and then suddenly felt hot all over a most like a hot flash.  She says the whole episode lasted about 1 to 2 minutes and then she will just feel a little warm and tingly all over for a few more minutes and then afterwards just feels "washed out" she said she has had it happen while just sitting and resting and doing nothing.  She was contributing it somewhat to some of the visual changes she been having that she has been working with her eye doctor on she has AMD but is actually been seeing spots in her vision particularly in her left eye.  She said she is even had these episodes wake her up in the middle the night about twice.  She denies any chest pain or shortness of breath.  She reports in the last 2 weeks has had a little bit of crackling but no pain in her right ear that she wanted me to be aware of.  Occasionally will feel her pulse in her ear.  In April after her Wynetta Emery and [place] injection she had the same symptoms.     See review MRI report below from July.    Narrative & Impression  CLINICAL DATA:  Photopsia.  EXAM: MRI HEAD AND ORBITS WITHOUT AND WITH CONTRAST  TECHNIQUE: Multiplanar, multiecho pulse sequences of the brain and surrounding structures were obtained without and with intravenous contrast. Multiplanar, multiecho pulse sequences of the orbits and surrounding structures were obtained including fat saturation techniques, before and after intravenous contrast administration.  CONTRAST:  7.39mL GADAVIST GADOBUTROL 1 MMOL/ML IV SOLN  COMPARISON:  None.  FINDINGS: MRI HEAD FINDINGS  Brain: There is a 4 mm focus of trace diffusion weighted signal hyperintensity in the right corona radiata which demonstrates normal ADC, T2/FLAIR hyperintensity, and no enhancement, potentially reflecting a subacute small vessel infarct. No definite acute infarct, intracranial hemorrhage, mass, midline shift, or extra-axial fluid collection is identified. Small T2 hyperintensities elsewhere in the cerebral white matter bilaterally are nonspecific but compatible with mild chronic small vessel ischemic disease.  No abnormal brain parenchymal or meningeal enhancement is identified. Dilated perivascular spaces asymmetrically involve the basal ganglia and subinsular regions on the right. Mild cerebral atrophy is within normal limits for age.  Vascular: Major intracranial vascular flow voids are preserved.  Skull and upper cervical spine: Unremarkable bone marrow signal.  Other: None.  MRI ORBITS FINDINGS  Orbits: Unremarkable appearance of the  globes status post left lens replacement. Normal appearance of the optic nerves without evidence of edema or abnormal enhancement. No orbital mass or inflammation. Unremarkable appearance of the extraocular muscles and lacrimal glands.  Visualized sinuses: Scattered mild mucosal thickening in the paranasal sinuses. Small volume fluid in the  right sphenoid sinus. Clear mastoid air cells.  Soft tissues: Unremarkable.  Limited intracranial: Unremarkable appearance of the optic chiasm, pituitary gland, and cavernous sinuses.  IMPRESSION: 1. Unremarkable appearance of the orbits. 2. Suspected subacute lacunar infarct in the right corona radiata. 3. Mild chronic small vessel ischemic disease.   Electronically Signed   By: Logan Bores M.D.   On: 05/13/2020 11:31     Past Medical History:  Diagnosis Date  . Fibroids    Seen on ultrasound in 2006.  . Macular degeneration   . Normal cardiac stress test    2006    Past Surgical History:  Procedure Laterality Date  . ABDOMINAL HYSTERECTOMY    . APPENDECTOMY    . BACK SURGERY    . discketcomy    . INCONTINENCE SURGERY      Family History  Problem Relation Age of Onset  . Prostate cancer Brother   . Fibromyalgia Sister   . Pancreatic cancer Mother     Social History   Socioeconomic History  . Marital status: Married    Spouse name: Iona Beard  . Number of children: 2   . Years of education: Not on file  . Highest education level: Not on file  Occupational History  . Occupation: Retired Quarry manager  Tobacco Use  . Smoking status: Never Smoker  . Smokeless tobacco: Never Used  Vaping Use  . Vaping Use: Never used  Substance and Sexual Activity  . Alcohol use: No  . Drug use: No  . Sexual activity: Not on file  Other Topics Concern  . Not on file  Social History Narrative   Planning on joining gym with her sister to add swimming.     Social Determinants of Health   Financial Resource Strain:   . Difficulty of Paying Living Expenses: Not on file  Food Insecurity:   . Worried About Charity fundraiser in the Last Year: Not on file  . Ran Out of Food in the Last Year: Not on file  Transportation Needs:   . Lack of Transportation (Medical): Not on file  . Lack of Transportation (Non-Medical): Not on file  Physical Activity:   . Days of Exercise per  Week: Not on file  . Minutes of Exercise per Session: Not on file  Stress:   . Feeling of Stress : Not on file  Social Connections:   . Frequency of Communication with Friends and Family: Not on file  . Frequency of Social Gatherings with Friends and Family: Not on file  . Attends Religious Services: Not on file  . Active Member of Clubs or Organizations: Not on file  . Attends Archivist Meetings: Not on file  . Marital Status: Not on file  Intimate Partner Violence:   . Fear of Current or Ex-Partner: Not on file  . Emotionally Abused: Not on file  . Physically Abused: Not on file  . Sexually Abused: Not on file    Outpatient Medications Prior to Visit  Medication Sig Dispense Refill  . cholecalciferol (VITAMIN D) 1000 UNITS tablet Take 2,000 Units by mouth daily.     . MULTIPLE VITAMINS PO Take 1 capsule by mouth daily.    Marland Kitchen  Multiple Vitamins-Minerals (PRESERVISION AREDS 2) CAPS Take 2 capsules by mouth daily.    . ondansetron (ZOFRAN ODT) 4 MG disintegrating tablet Take one tab by mouth Q6hr prn nausea.  Dissolve under tongue. 12 tablet 0  . sulfamethoxazole-trimethoprim (BACTRIM DS,SEPTRA DS) 800-160 MG tablet Take 1 tablet by mouth 2 (two) times daily. 20 tablet 0   No facility-administered medications prior to visit.    No Known Allergies  ROS Review of Systems    Objective:    Physical Exam Constitutional:      Appearance: She is well-developed.  HENT:     Head: Normocephalic and atraumatic.     Right Ear: Tympanic membrane, ear canal and external ear normal.     Left Ear: Tympanic membrane, ear canal and external ear normal.     Nose: Nose normal.     Mouth/Throat:     Mouth: Mucous membranes are moist.     Pharynx: Oropharynx is clear.  Eyes:     Conjunctiva/sclera: Conjunctivae normal.     Pupils: Pupils are equal, round, and reactive to light.  Neck:     Thyroid: No thyromegaly.  Cardiovascular:     Rate and Rhythm: Normal rate and regular  rhythm.     Heart sounds: Normal heart sounds.     Comments: No carotid bruits.  No renal artery bruits. Pulmonary:     Effort: Pulmonary effort is normal.     Breath sounds: Normal breath sounds. No wheezing.  Abdominal:     General: Abdomen is flat. Bowel sounds are normal.     Palpations: Abdomen is soft.  Musculoskeletal:     Cervical back: Neck supple.  Lymphadenopathy:     Cervical: No cervical adenopathy.  Skin:    General: Skin is warm and dry.  Neurological:     General: No focal deficit present.     Mental Status: She is alert and oriented to person, place, and time.     Cranial Nerves: No cranial nerve deficit.     Gait: Gait normal.  Psychiatric:        Mood and Affect: Mood normal.        Behavior: Behavior normal.     BP 125/69   Pulse 87   Ht 5\' 2"  (1.575 m)   Wt 161 lb (73 kg)   SpO2 99%   BMI 29.45 kg/m  Wt Readings from Last 3 Encounters:  06/20/20 161 lb (73 kg)  08/16/19 168 lb (76.2 kg)  07/14/18 160 lb (72.6 kg)     Health Maintenance Due  Topic Date Due  . Hepatitis C Screening  Never done  . INFLUENZA VACCINE  05/19/2020    There are no preventive care reminders to display for this patient.  Lab Results  Component Value Date   TSH 1.35 07/04/2018   Lab Results  Component Value Date   WBC 8.1 07/31/2010   HGB 14.9 07/31/2010   HCT 45.0 07/31/2010   MCV 97.2 07/31/2010   PLT 270 07/31/2010   Lab Results  Component Value Date   NA 145 07/04/2018   K 5.1 07/04/2018   CO2 27 07/04/2018   GLUCOSE 97 07/04/2018   BUN 18 07/04/2018   CREATININE 0.92 07/04/2018   BILITOT 1.4 (H) 07/04/2018   ALKPHOS 58 03/30/2017   AST 17 07/04/2018   ALT 6 07/04/2018   PROT 6.9 07/04/2018   ALBUMIN 4.3 03/30/2017   CALCIUM 9.9 07/04/2018   Lab Results  Component Value Date  CHOL 232 (H) 07/04/2018   Lab Results  Component Value Date   HDL 54 07/04/2018   Lab Results  Component Value Date   LDLCALC 144 (H) 07/04/2018   Lab Results   Component Value Date   TRIG 196 (H) 07/04/2018   Lab Results  Component Value Date   CHOLHDL 4.3 07/04/2018   No results found for: HGBA1C    Assessment & Plan:   Problem List Items Addressed This Visit      Genitourinary   CKD (chronic kidney disease) stage 3, GFR 30-59 ml/min    Well overdue to recheck renal function we have not had up-to-date labs on her numbers 2 years which was about the last time I saw her.      Relevant Orders   Urine Microalbumin w/creat. ratio     Other   Hyperlipidemia    To update lipids.  We did discuss going ahead and starting a statin as below.      Relevant Medications   atorvastatin (LIPITOR) 20 MG tablet   Other Relevant Orders   Lipid panel   History of stroke    Evidence of possible subacute lacunar infarct in the right corona radiata as seen on MRI of the brain July 2021 ordered by ophthalmology for visual changes.  Discussed that this is highly suspicious for possible stroke.  She has already started a baby aspirin 81 mg when we called her about a week ago after receiving the results she has been tolerating that well we just discussed making sure she takes it with food and recommend taking an enteric-coated aspirin.  Also discussed starting a statin.  She is had mild elevations in LDL in the past.  Like to recheck her lipids to see where things are at and then start a statin to lower her risk and discussed how the medication works to do that.  We will plan to recheck lipids in about 6 to 8 weeks.      Episodic lightheadedness    Unclear etiology I am not sure if this is related to the finding on the MRI or not this sounds like it could be different.  I would like to work this up further these episodes seem to be brief but sudden and have even woken her up in the middle the night which I find highly unusual.  We will get some labs to rule out electrolyte, thyroid abnormality and rule out anemia.  They do not necessarily occur with exertion.   We will also go ahead and get an echocardiogram to look for low valvular dysfunction and to also get a carotid Doppler to evaluate for stenosis.  Meantime go ahead and start low-dose aspirin and statin.      Relevant Orders   ECHOCARDIOGRAM COMPLETE   US Carotid Duplex Bilateral   CBC   COMPLETE METABOLIC PANEL WITH GFR   Lipid panel   TSH   Hemoglobin A1c    Other Visit Diagnoses    Right-sided lacunar infarction [facility])    -  Primary   Relevant Orders   ECHOCARDIOGRAM COMPLETE   US Carotid Duplex Bilateral   CBC   COMPLETE METABOLIC PANEL WITH GFR   Lipid panel   TSH   Hemoglobin A1c      Meds ordered this encounter  Medications  . atorvastatin (LIPITOR) 20 MG tablet    Sig: Take 1 tablet (20 mg total) by mouth at bedtime.    Dispense:  90 tablet  Refill:  3    Follow-up: Return in about 4 weeks (around 07/18/2020).   Time spent 35 min in encounter.   Beatrice Lecher, MD

## 2020-06-20 NOTE — Assessment & Plan Note (Signed)
Evidence of possible subacute lacunar infarct in the right corona radiata as seen on MRI of the brain July 2021 ordered by ophthalmology for visual changes.  Discussed that this is highly suspicious for possible stroke.  She has already started a baby aspirin 81 mg when we called her about a week ago after receiving the results she has been tolerating that well we just discussed making sure she takes it with food and recommend taking an enteric-coated aspirin.  Also discussed starting a statin.  She is had mild elevations in LDL in the past.  Like to recheck her lipids to see where things are at and then start a statin to lower her risk and discussed how the medication works to do that.  We will plan to recheck lipids in about 6 to 8 weeks.

## 2020-06-20 NOTE — Assessment & Plan Note (Signed)
Unclear etiology I am not sure if this is related to the finding on the MRI or not this sounds like it could be different.  I would like to work this up further these episodes seem to be brief but sudden and have even woken her up in the middle the night which I find highly unusual.  We will get some labs to rule out electrolyte, thyroid abnormality and rule out anemia.  They do not necessarily occur with exertion.  We will also go ahead and get an echocardiogram to look for low valvular dysfunction and to also get a carotid Doppler to evaluate for stenosis.  Meantime go ahead and start low-dose aspirin and statin.

## 2020-06-21 DIAGNOSIS — E785 Hyperlipidemia, unspecified: Secondary | ICD-10-CM | POA: Diagnosis not present

## 2020-06-21 DIAGNOSIS — R739 Hyperglycemia, unspecified: Secondary | ICD-10-CM | POA: Diagnosis not present

## 2020-06-21 DIAGNOSIS — I6381 Other cerebral infarction due to occlusion or stenosis of small artery: Secondary | ICD-10-CM | POA: Diagnosis not present

## 2020-06-21 DIAGNOSIS — R42 Dizziness and giddiness: Secondary | ICD-10-CM | POA: Diagnosis not present

## 2020-06-21 DIAGNOSIS — N1831 Chronic kidney disease, stage 3a: Secondary | ICD-10-CM | POA: Diagnosis not present

## 2020-06-22 LAB — COMPLETE METABOLIC PANEL WITH GFR
AG Ratio: 1.7 (calc) (ref 1.0–2.5)
ALT: 4 U/L — ABNORMAL LOW (ref 6–29)
AST: 15 U/L (ref 10–35)
Albumin: 4.3 g/dL (ref 3.6–5.1)
Alkaline phosphatase (APISO): 71 U/L (ref 37–153)
BUN/Creatinine Ratio: 16 (calc) (ref 6–22)
BUN: 16 mg/dL (ref 7–25)
CO2: 26 mmol/L (ref 20–32)
Calcium: 9.3 mg/dL (ref 8.6–10.4)
Chloride: 107 mmol/L (ref 98–110)
Creat: 0.97 mg/dL — ABNORMAL HIGH (ref 0.60–0.93)
GFR, Est African American: 65 mL/min/{1.73_m2} (ref >=60)
GFR, Est Non African American: 56 mL/min/{1.73_m2} — ABNORMAL LOW (ref >=60)
Globulin: 2.5 g/dL (calc) (ref 1.9–3.7)
Glucose, Bld: 89 mg/dL (ref 65–99)
Potassium: 5.2 mmol/L (ref 3.5–5.3)
Sodium: 142 mmol/L (ref 135–146)
Total Bilirubin: 1.6 mg/dL — ABNORMAL HIGH (ref 0.2–1.2)
Total Protein: 6.8 g/dL (ref 6.1–8.1)

## 2020-06-22 LAB — CBC
HCT: 41 % (ref 35.0–45.0)
Hemoglobin: 13.9 g/dL (ref 11.7–15.5)
MCH: 32.9 pg (ref 27.0–33.0)
MCHC: 33.9 g/dL (ref 32.0–36.0)
MCV: 97.2 fL (ref 80.0–100.0)
MPV: 10.7 fL (ref 7.5–12.5)
Platelets: 299 10*3/uL (ref 140–400)
RBC: 4.22 10*6/uL (ref 3.80–5.10)
RDW: 11.8 % (ref 11.0–15.0)
WBC: 9 10*3/uL (ref 3.8–10.8)

## 2020-06-22 LAB — MICROALBUMIN / CREATININE URINE RATIO
Creatinine, Urine: 180 mg/dL (ref 20–275)
Microalb Creat Ratio: 15 mcg/mg creat (ref <30)
Microalb, Ur: 2.7 mg/dL

## 2020-06-22 LAB — LIPID PANEL
Cholesterol: 209 mg/dL — ABNORMAL HIGH (ref <200)
HDL: 50 mg/dL (ref >=50)
LDL Cholesterol (Calc): 133 mg/dL (calc) — ABNORMAL HIGH
Non-HDL Cholesterol (Calc): 159 mg/dL (calc) — ABNORMAL HIGH (ref <130)
Total CHOL/HDL Ratio: 4.2 (calc) (ref <5.0)
Triglycerides: 143 mg/dL (ref <150)

## 2020-06-22 LAB — TSH: TSH: 1.74 mIU/L (ref 0.40–4.50)

## 2020-06-22 LAB — HEMOGLOBIN A1C
Hgb A1c MFr Bld: 5.4 % of total Hgb (ref <5.7)
Mean Plasma Glucose: 108 (calc)
eAG (mmol/L): 6 (calc)

## 2020-06-26 ENCOUNTER — Other Ambulatory Visit: Payer: Self-pay

## 2020-06-26 ENCOUNTER — Ambulatory Visit: Payer: Medicare Other

## 2020-06-26 DIAGNOSIS — I6381 Other cerebral infarction due to occlusion or stenosis of small artery: Secondary | ICD-10-CM

## 2020-06-26 DIAGNOSIS — I6523 Occlusion and stenosis of bilateral carotid arteries: Secondary | ICD-10-CM | POA: Diagnosis not present

## 2020-06-26 DIAGNOSIS — R42 Dizziness and giddiness: Secondary | ICD-10-CM

## 2020-07-15 ENCOUNTER — Other Ambulatory Visit: Payer: Self-pay

## 2020-07-15 ENCOUNTER — Ambulatory Visit (HOSPITAL_BASED_OUTPATIENT_CLINIC_OR_DEPARTMENT_OTHER)
Admission: RE | Admit: 2020-07-15 | Discharge: 2020-07-15 | Disposition: A | Discharge status: Home / Self Care | Payer: Medicare Other | Source: Ambulatory Visit | Attending: Family Medicine | Admitting: Family Medicine

## 2020-07-15 DIAGNOSIS — R42 Dizziness and giddiness: Secondary | ICD-10-CM | POA: Diagnosis not present

## 2020-07-15 DIAGNOSIS — I6381 Other cerebral infarction due to occlusion or stenosis of small artery: Secondary | ICD-10-CM

## 2020-07-15 LAB — ECHOCARDIOGRAM COMPLETE
Area-P 1/2: 3.63 cm2
P 1/2 time: 444 msec
S' Lateral: 3.04 cm

## 2020-07-16 ENCOUNTER — Ambulatory Visit (INDEPENDENT_AMBULATORY_CARE_PROVIDER_SITE_OTHER): Payer: Medicare Other | Admitting: Family Medicine

## 2020-07-16 ENCOUNTER — Encounter: Payer: Self-pay | Admitting: Family Medicine

## 2020-07-16 VITALS — BP 136/63 | HR 71 | Wt 160.0 lb

## 2020-07-16 DIAGNOSIS — I5189 Other ill-defined heart diseases: Secondary | ICD-10-CM | POA: Insufficient documentation

## 2020-07-16 DIAGNOSIS — I77819 Aortic ectasia, unspecified site: Secondary | ICD-10-CM

## 2020-07-16 DIAGNOSIS — Z23 Encounter for immunization: Secondary | ICD-10-CM

## 2020-07-16 DIAGNOSIS — I712 Thoracic aortic aneurysm, without rupture, unspecified: Secondary | ICD-10-CM | POA: Insufficient documentation

## 2020-07-16 DIAGNOSIS — R42 Dizziness and giddiness: Secondary | ICD-10-CM | POA: Diagnosis not present

## 2020-07-16 DIAGNOSIS — E785 Hyperlipidemia, unspecified: Secondary | ICD-10-CM

## 2020-07-16 DIAGNOSIS — I519 Heart disease, unspecified: Secondary | ICD-10-CM | POA: Diagnosis not present

## 2020-07-16 NOTE — Patient Instructions (Signed)
Plan to recheck you cholesterol and liver in about 8 weeks.

## 2020-07-16 NOTE — Progress Notes (Signed)
Established Patient Office Visit  Subjective:  Patient ID: Sherri Allison, female    DOB: 02/26/1941  Age: 79 y.o. MRN: [id number]  CC:  Chief Complaint  Patient presents with  . Follow-up    echocardiogram     HPI Sherri Allison presents for follow-up of her echocardiogram results which she had performed yesterday.  Seen her earlier this month where she had complained of some lightheadedness and denied vertigo.  She had carotid Dopplers performed on September 8 which did not show any significant plaque formation.  Fortunately she has not had any more episodes of feeling lightheaded or dizzy.  Past Medical History:  Diagnosis Date  . Fibroids    Seen on ultrasound in 2006.  . Macular degeneration   . Normal cardiac stress test    2006    Past Surgical History:  Procedure Laterality Date  . ABDOMINAL HYSTERECTOMY    . APPENDECTOMY    . BACK SURGERY    . discketcomy    . INCONTINENCE SURGERY      Family History  Problem Relation Age of Onset  . Prostate cancer Brother   . Fibromyalgia Sister   . Pancreatic cancer Mother     Social History   Socioeconomic History  . Marital status: Married    Spouse name: Iona Beard  . Number of children: 2   . Years of education: Not on file  . Highest education level: Not on file  Occupational History  . Occupation: Retired Quarry manager  Tobacco Use  . Smoking status: Never Smoker  . Smokeless tobacco: Never Used  Vaping Use  . Vaping Use: Never used  Substance and Sexual Activity  . Alcohol use: No  . Drug use: No  . Sexual activity: Not on file  Other Topics Concern  . Not on file  Social History Narrative   Planning on joining gym with her sister to add swimming.     Social Determinants of Health   Financial Resource Strain:   . Difficulty of Paying Living Expenses: Not on file  Food Insecurity:   . Worried About Charity fundraiser in the Last Year: Not on file  . Ran Out of Food in the Last Year: Not on file   Transportation Needs:   . Lack of Transportation (Medical): Not on file  . Lack of Transportation (Non-Medical): Not on file  Physical Activity:   . Days of Exercise per Week: Not on file  . Minutes of Exercise per Session: Not on file  Stress:   . Feeling of Stress : Not on file  Social Connections:   . Frequency of Communication with Friends and Family: Not on file  . Frequency of Social Gatherings with Friends and Family: Not on file  . Attends Religious Services: Not on file  . Active Member of Clubs or Organizations: Not on file  . Attends Archivist Meetings: Not on file  . Marital Status: Not on file  Intimate Partner Violence:   . Fear of Current or Ex-Partner: Not on file  . Emotionally Abused: Not on file  . Physically Abused: Not on file  . Sexually Abused: Not on file    Outpatient Medications Prior to Visit  Medication Sig Dispense Refill  . atorvastatin (LIPITOR) 20 MG tablet Take 1 tablet (20 mg total) by mouth at bedtime. 90 tablet 3  . cholecalciferol (VITAMIN D) 1000 UNITS tablet Take 2,000 Units by mouth daily.     . MULTIPLE VITAMINS PO  Take 1 capsule by mouth daily.    . Multiple Vitamins-Minerals (PRESERVISION AREDS 2) CAPS Take 2 capsules by mouth daily.    . ondansetron (ZOFRAN ODT) 4 MG disintegrating tablet Take one tab by mouth Q6hr prn nausea.  Dissolve under tongue. 12 tablet 0   No facility-administered medications prior to visit.    No Known Allergies  ROS Review of Systems    Objective:    Physical Exam Constitutional:      Appearance: She is well-developed.  HENT:     Head: Normocephalic and atraumatic.  Cardiovascular:     Rate and Rhythm: Normal rate and regular rhythm.     Heart sounds: Normal heart sounds.  Pulmonary:     Effort: Pulmonary effort is normal.     Breath sounds: Normal breath sounds.  Skin:    General: Skin is warm and dry.  Neurological:     Mental Status: She is alert and oriented to person, place,  and time.  Psychiatric:        Behavior: Behavior normal.     BP 136/63   Pulse 71   Wt 160 lb (72.6 kg)   SpO2 100%   BMI 29.26 kg/m  Wt Readings from Last 3 Encounters:  07/16/20 160 lb (72.6 kg)  06/20/20 161 lb (73 kg)  08/16/19 168 lb (76.2 kg)     Health Maintenance Due  Topic Date Due  . Hepatitis C Screening  Never done    There are no preventive care reminders to display for this patient.  Lab Results  Component Value Date   TSH 1.74 06/21/2020   Lab Results  Component Value Date   WBC 9.0 06/21/2020   HGB 13.9 06/21/2020   HCT 41.0 06/21/2020   MCV 97.2 06/21/2020   PLT 299 06/21/2020   Lab Results  Component Value Date   NA 142 06/21/2020   K 5.2 06/21/2020   CO2 26 06/21/2020   GLUCOSE 89 06/21/2020   BUN 16 06/21/2020   CREATININE 0.97 (H) 06/21/2020   BILITOT 1.6 (H) 06/21/2020   ALKPHOS 58 03/30/2017   AST 15 06/21/2020   ALT 4 (L) 06/21/2020   PROT 6.8 06/21/2020   ALBUMIN 4.3 03/30/2017   CALCIUM 9.3 06/21/2020   Lab Results  Component Value Date   CHOL 209 (H) 06/21/2020   Lab Results  Component Value Date   HDL 50 06/21/2020   Lab Results  Component Value Date   LDLCALC 133 (H) 06/21/2020   Lab Results  Component Value Date   TRIG 143 06/21/2020   Lab Results  Component Value Date   CHOLHDL 4.2 06/21/2020   Lab Results  Component Value Date   HGBA1C 5.4 06/21/2020      Assessment & Plan:   Problem List Items Addressed This Visit      Cardiovascular and Mediastinum   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  Recommend repeat echo in 1 year blood pressure is well controlled  Echo did show EF of 55 to 60% with grade 1 diastolic dysfunction, aortic valve regurgitation which was mild and mild aortic valve sclerosis.  Reviewed these results with her in detail today.        Other   Hyperlipidemia    Tolerating atorvastatin well without any  side effects she says she has not had any cramping so far.      Grade I diastolic dysfunction  Episodic lightheadedness    Unclear etiology unfortunately but she did have fairly normal carotid Dopplers and overall her echocardiogram looks great she is not had any more episodes since I saw her 4 weeks ago which is also reassuring and she has been tolerating her statin well without any side effects or problems.       Other Visit Diagnoses    Need for immunization against influenza    -  Primary   Relevant Orders   Flu Vaccine QUAD High Dose(Fluad) (Completed)      No orders of the defined types were placed in this encounter.   Follow-up: Return in about 4 months (around 11/15/2020).   Time spent 25 minutes in encounter.  Beatrice Lecher, MD

## 2020-07-16 NOTE — Assessment & Plan Note (Addendum)
Echocardiogram from July 15, 2020 showing a dilated ascending aorta measuring 41 mm.  Recommend repeat echo in 1 year blood pressure is well controlled  Echo did show EF of 55 to 60% with grade 1 diastolic dysfunction, aortic valve regurgitation which was mild and mild aortic valve sclerosis.  Reviewed these results with her in detail today.

## 2020-07-16 NOTE — Assessment & Plan Note (Signed)
Unclear etiology unfortunately but she did have fairly normal carotid Dopplers and overall her echocardiogram looks great she is not had any more episodes since I saw her 4 weeks ago which is also reassuring and she has been tolerating her statin well without any side effects or problems.

## 2020-07-16 NOTE — Assessment & Plan Note (Signed)
Tolerating atorvastatin well without any side effects she says she has not had any cramping so far.

## 2020-07-16 NOTE — Progress Notes (Signed)
Pt had her Echo done yesterday and would like to discuss the results.

## 2020-08-17 DIAGNOSIS — M62830 Muscle spasm of back: Secondary | ICD-10-CM | POA: Diagnosis not present

## 2020-08-17 DIAGNOSIS — R9431 Abnormal electrocardiogram [ECG] [EKG]: Secondary | ICD-10-CM | POA: Diagnosis not present

## 2020-08-17 DIAGNOSIS — Z8673 Personal history of transient ischemic attack (TIA), and cerebral infarction without residual deficits: Secondary | ICD-10-CM | POA: Diagnosis not present

## 2020-08-17 DIAGNOSIS — I712 Thoracic aortic aneurysm, without rupture: Secondary | ICD-10-CM | POA: Diagnosis not present

## 2020-08-17 DIAGNOSIS — K7689 Other specified diseases of liver: Secondary | ICD-10-CM | POA: Diagnosis not present

## 2020-08-17 DIAGNOSIS — R569 Unspecified convulsions: Secondary | ICD-10-CM | POA: Diagnosis not present

## 2020-08-17 DIAGNOSIS — Z743 Need for continuous supervision: Secondary | ICD-10-CM | POA: Diagnosis not present

## 2020-08-17 DIAGNOSIS — Z7982 Long term (current) use of aspirin: Secondary | ICD-10-CM | POA: Diagnosis not present

## 2020-08-17 DIAGNOSIS — R52 Pain, unspecified: Secondary | ICD-10-CM | POA: Diagnosis not present

## 2020-08-17 DIAGNOSIS — M549 Dorsalgia, unspecified: Secondary | ICD-10-CM | POA: Diagnosis not present

## 2020-08-17 DIAGNOSIS — N390 Urinary tract infection, site not specified: Secondary | ICD-10-CM | POA: Diagnosis not present

## 2020-08-17 DIAGNOSIS — I714 Abdominal aortic aneurysm, without rupture: Secondary | ICD-10-CM | POA: Diagnosis not present

## 2020-08-17 DIAGNOSIS — K449 Diaphragmatic hernia without obstruction or gangrene: Secondary | ICD-10-CM | POA: Diagnosis not present

## 2020-08-17 DIAGNOSIS — K5730 Diverticulosis of large intestine without perforation or abscess without bleeding: Secondary | ICD-10-CM | POA: Diagnosis not present

## 2020-08-17 DIAGNOSIS — J9811 Atelectasis: Secondary | ICD-10-CM | POA: Diagnosis not present

## 2020-08-17 DIAGNOSIS — R079 Chest pain, unspecified: Secondary | ICD-10-CM | POA: Diagnosis not present

## 2020-08-17 DIAGNOSIS — R918 Other nonspecific abnormal finding of lung field: Secondary | ICD-10-CM | POA: Diagnosis not present

## 2020-08-19 ENCOUNTER — Encounter: Payer: Self-pay | Admitting: Medical-Surgical

## 2020-08-19 ENCOUNTER — Ambulatory Visit (INDEPENDENT_AMBULATORY_CARE_PROVIDER_SITE_OTHER): Payer: Medicare Other | Admitting: Medical-Surgical

## 2020-08-19 VITALS — BP 134/80 | HR 84 | Ht 62.0 in | Wt 161.0 lb

## 2020-08-19 DIAGNOSIS — M62830 Muscle spasm of back: Secondary | ICD-10-CM

## 2020-08-19 DIAGNOSIS — N3000 Acute cystitis without hematuria: Secondary | ICD-10-CM

## 2020-08-19 DIAGNOSIS — R569 Unspecified convulsions: Secondary | ICD-10-CM | POA: Diagnosis not present

## 2020-08-19 MED ORDER — TIZANIDINE HCL 2 MG PO TABS: 2.0000 mg | ORAL_TABLET | Freq: Four times a day (QID) | ORAL | 0 refills | Status: DC | PRN

## 2020-08-19 NOTE — Progress Notes (Signed)
Subjective:    CC: ER follow up  HPI: Pleasant 79 year old female accompanied by her husband presenting for hospital ER follow up. She was seen at [facility] on 08/17/2020 and diagnosed with a UTI, back spasm, and seizure activity. Her husband reports waking the prior evening around 10pm to find her lying in the bed, shaking with her arms up and very rigid. This lasted 1-2 minutes. He grabbed her shoulders and called her name without response. After the shaking stopped, she lay there breathing heavily for a bit before finally dozing off again. He went back to sleep and was awakened again at 3am with her sitting on the edge of the bed. She told him that she was having chest and back pain, feeling like she couldn't breathe, and asking him to call EMS. No prior history of seizure activity. She has no memory of the episode. Notes that she did not have any urinary symptoms prior to that night. She does have a mesh bladder sling that has been in place for 10 years. She was discharged from the hospital on Omnicef BID x 7 days and methocarbamol four times daily for back spasms. She is taking the Omnicef, tolerating well without side effects. She is taking the methocarbamol, tolerating well but this is only making her sleepy and not helping her pain. She takes Tylenol as needed but this is also not helping. Has tried heat and ice but these do not seem helpful. On review of hospital records, her CT showed an old compression fracture of T4 which is consistent with the level of her pain. Notes that pain spreads axially across her back, around her ribs bilaterally and under her breasts. Taking deep breaths worsens pain. Denies fever, chills, shortness of breath, urinary symptoms, and GI upset.   I reviewed the past medical history, family history, social history, surgical history, and allergies today and no changes were needed.  Please see the problem list section below in epic for further details.  Past Medical  History: Past Medical History:  Diagnosis Date  . Fibroids    Seen on ultrasound in 2006.  . Macular degeneration   . Normal cardiac stress test    2006   Past Surgical History: Past Surgical History:  Procedure Laterality Date  . ABDOMINAL HYSTERECTOMY    . APPENDECTOMY    . BACK SURGERY    . discketcomy    . INCONTINENCE SURGERY     Social History: Social History   Socioeconomic History  . Marital status: Married    Spouse name: Iona Beard  . Number of children: 2   . Years of education: Not on file  . Highest education level: Not on file  Occupational History  . Occupation: Retired Quarry manager  Tobacco Use  . Smoking status: Never Smoker  . Smokeless tobacco: Never Used  Vaping Use  . Vaping Use: Never used  Substance and Sexual Activity  . Alcohol use: No  . Drug use: No  . Sexual activity: Not on file  Other Topics Concern  . Not on file  Social History Narrative   Planning on joining gym with her sister to add swimming.     Social Determinants of Health   Financial Resource Strain:   . Difficulty of Paying Living Expenses: Not on file  Food Insecurity:   . Worried About Charity fundraiser in the Last Year: Not on file  . Ran Out of Food in the Last Year: Not on file  Transportation Needs:   .  Lack of Transportation (Medical): Not on file  . Lack of Transportation (Non-Medical): Not on file  Physical Activity:   . Days of Exercise per Week: Not on file  . Minutes of Exercise per Session: Not on file  Stress:   . Feeling of Stress : Not on file  Social Connections:   . Frequency of Communication with Friends and Family: Not on file  . Frequency of Social Gatherings with Friends and Family: Not on file  . Attends Religious Services: Not on file  . Active Member of Clubs or Organizations: Not on file  . Attends Archivist Meetings: Not on file  . Marital Status: Not on file   Family History: Family History  Problem Relation Age of Onset  .  Prostate cancer Brother   . Fibromyalgia Sister   . Pancreatic cancer Mother    Allergies: No Known Allergies Medications: See med rec.  Review of Systems: See HPI for pertinent positives and negatives.   Objective:    General: Well Developed, well nourished, and in no acute distress.  Neuro: Alert and oriented x3, extra-ocular muscles intact, sensation grossly intact.  HEENT: Normocephalic, atraumatic.  Skin: Warm and dry. Cardiac: Regular rate and rhythm, no murmurs rubs or gallops, no lower extremity edema.  Respiratory: Clear to auscultation bilaterally. Not using accessory muscles, speaking in full sentences. MSK: Tenderness across thoracic paraspinal muscles bilaterally extending around ribs to just beneath bilateral breasts.   Impression and Recommendations:    1. Seizure [facility]) Per the description provided by her husband, this was definitely seizure activity. Referring to Neurology for further evaluation.  - Ambulatory referral to Neurology  2. Acute cystitis without hematuria Continue Omnicef BID as scheduled.   3. Back spasm Methocarbamol not helpful. Switching to Tizanidine 2-4mg  every 6 hours as needed. Continue Tylenol, heat, and ice. Recommend TENS unit to help with pain. Likely related to muscle spasms during seizure activity and should resolve or at least improve over the next week. Unclear if T4 compression fracture is old as I did not see this in the chart on limited review.   Return for appointment as scheduled with Dr. Madilyn Fireman. __________________________________________ Clearnce Sorrel, DNP, APRN, FNP-BC Primary Care and Sports Medicine [place]

## 2020-08-21 ENCOUNTER — Ambulatory Visit (INDEPENDENT_AMBULATORY_CARE_PROVIDER_SITE_OTHER): Payer: Medicare Other | Admitting: Family Medicine

## 2020-08-21 ENCOUNTER — Encounter: Payer: Self-pay | Admitting: Family Medicine

## 2020-08-21 VITALS — Ht 62.0 in | Wt 160.0 lb

## 2020-08-21 DIAGNOSIS — R748 Abnormal levels of other serum enzymes: Secondary | ICD-10-CM | POA: Diagnosis not present

## 2020-08-21 DIAGNOSIS — R569 Unspecified convulsions: Secondary | ICD-10-CM

## 2020-08-21 DIAGNOSIS — Z800 Family history of malignant neoplasm of digestive organs: Secondary | ICD-10-CM

## 2020-08-21 DIAGNOSIS — R0789 Other chest pain: Secondary | ICD-10-CM

## 2020-08-21 DIAGNOSIS — M549 Dorsalgia, unspecified: Secondary | ICD-10-CM | POA: Diagnosis not present

## 2020-08-21 NOTE — Progress Notes (Signed)
Established Patient Office Visit  Subjective:  Patient ID: Sherri Allison, female    DOB: 08-22-1941  Age: 79 y.o. MRN: [id number]  CC:  Chief Complaint  Patient presents with  . Hospitalization Follow-up    HPI [name] presents for follow-up from recent hospitalization.  She actually saw one of the nurse practitioners in our office a couple of days ago.  She is getting gradually better she feels better overall though she still having a lot of soreness particularly in her chest and upper back she feels like it is more muscular from the seizure activity that she had she had no prior history of any type of seizure disorder during her lifetime to this is highly unusual they felt like it could be secondary to a UTI.  Though she was not septic and did not have any systemic symptoms at that time to this is a little unusual.  They have scheduled her with a neurologist at [facility] call Dr. Murriel Hopper.  She does have an upcoming appointment in December and wants to know if she should keep that.  She reports she is still completing her antibiotics, cefdinir 300 mg for the UTI.  She has not had any residual symptoms or discomfort in fact she says she never really had any symptoms.  On her labs there she was noted to have a significantly elevated creatinine kinase which is not unusual after seizure activity.  But she was also noticed to have an elevated lipase which is a little unusual.  She did mention that her mother actually passed away from pancreatic cancer in her 63s.   CT Head WO IV Contrast  Anatomical Region Laterality Modality  Head -- Computed Tomography  Impression Performed by [id number] IMPRESSION:   No acute intracranial abnormality identified.   Electronically Signed by: Zettie Pho Narrative  INDICATION:seizure   TECHNIQUE: CT HEAD WO IV CONTRAST   FINDINGS:   No intracranial hemorrhage identified.  No acute midline shift or acute mass effect.  No abnormal  extra-axial fluid collections are seen.   Ventricles, cisterns, and sulci are unremarkable.  Visualized orbits are unremarkable.   Visualized paranasal sinuses are clear.   Mastoid air cells are clear.  No displaced or depressed calvarial fracture identified.  CT Angio Chest/Abdomen/Pelvis  Anatomical Region Laterality Modality  Chest -- Computed Tomography  Impression Performed by [id number] IMPRESSION:   CTA CHEST   1. Negative for thoracic aortic dissection .  2. 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  3. Negative for acute pulmonary infiltrate or pleural effusion.    CTA ABDOMEN AND PELVIS   1. Negative for abdominal aortic dissection or aneurysm.  2. Chronic nonemergent findings.    Electronically Signed by: Zettie Pho Narrative Performed by [id number] COMPARISON: 08/17/2020   INDICATION: Thoracic aortic aneurysm (TAA) suspected   TECHNIQUE: CT ANGIO CHEST/ABDOMEN/PELVIS   CONTRAST: 70 mL Isovue-370,   3-D reformats were generated and reviewed on an independent workstation and saved to PACS   Dose reduction was utilized (automated exposure control, mA or kV adjustment based on patient size, or iterative image reconstruction).   FINDINGS:    CTA CHEST:   Lower neck: Unremarkable   Lungs: Mild bilateral dependent and basilar subsegmental atelectasis. No suspicious acute pulmonary infiltrate identified.   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 Negative for dissection.    Pulmonary arteries: Negative for acute pulmonary embolus.   Mediastinum: Unremarkable  Lymph nodes: Unremarkable   Pleura: No pleural effusions. No pneumothorax.   Chest walls: Unremarkable   Bones: Old moderate T4 superior endplate compression fracture.    CTA ABDOMEN AND PELVIS:   Liver: Liver cysts.   Gallbladder and Biliary Tree: Unremarkable   Pancreas: Unremarkable   Spleen: Unremarkable    Adrenals: Unremarkable   Kidneys and Urinary Bladder: Unremarkable   Reproductive: Hysterectomy.   Abdominal Aorta: Negative for aneurysm. Negative fordissection. Unremarkable mesenteric arteries. Unremarkable renal arteries. Unremarkable pelvic vasculature.   Lymph Nodes: Unremarkable   Peritoneum: No free fluid. No free intraperitoneal air.   Gastrointestinal Tract: Small hiatal hernia. Negative for obstruction. Negative for acute appendicitis. Colonic diverticulosis.   Abdominal and Pelvic Walls: Small fat-containing umbilical hernia      Past Medical History:  Diagnosis Date  . Fibroids    Seen on ultrasound in 2006.  . Macular degeneration   . Normal cardiac stress test    2006    Past Surgical History:  Procedure Laterality Date  . ABDOMINAL HYSTERECTOMY    . APPENDECTOMY    . BACK SURGERY    . discketcomy    . INCONTINENCE SURGERY      Family History  Problem Relation Age of Onset  . Prostate cancer Brother   . Fibromyalgia Sister   . Pancreatic cancer Mother     Social History   Socioeconomic History  . Marital status: Married    Spouse name: Iona Beard  . Number of children: 2   . Years of education: Not on file  . Highest education level: Not on file  Occupational History  . Occupation: Retired Quarry manager  Tobacco Use  . Smoking status: Never Smoker  . Smokeless tobacco: Never Used  Vaping Use  . Vaping Use: Never used  Substance and Sexual Activity  . Alcohol use: No  . Drug use: No  . Sexual activity: Not on file  Other Topics Concern  . Not on file  Social History Narrative   Planning on joining gym with her sister to add swimming.     Social Determinants of Health   Financial Resource Strain:   . Difficulty of Paying Living Expenses: Not on file  Food Insecurity:   . Worried About Charity fundraiser in the Last Year: Not on file  . Ran Out of Food in the Last Year: Not on file  Transportation Needs:   . Lack of Transportation  (Medical): Not on file  . Lack of Transportation (Non-Medical): Not on file  Physical Activity:   . Days of Exercise per Week: Not on file  . Minutes of Exercise per Session: Not on file  Stress:   . Feeling of Stress : Not on file  Social Connections:   . Frequency of Communication with Friends and Family: Not on file  . Frequency of Social Gatherings with Friends and Family: Not on file  . Attends Religious Services: Not on file  . Active Member of Clubs or Organizations: Not on file  . Attends Archivist Meetings: Not on file  . Marital Status: Not on file  Intimate Partner Violence:   . Fear of Current or Ex-Partner: Not on file  . Emotionally Abused: Not on file  . Physically Abused: Not on file  . Sexually Abused: Not on file    Outpatient Medications Prior to Visit  Medication Sig Dispense Refill  . atorvastatin (LIPITOR) 20 MG tablet Take 1 tablet (20 mg total) by mouth at bedtime. [place]  tablet 3  . cefdinir (OMNICEF) 300 MG capsule Take 300 mg by mouth 2 (two) times daily.    . cholecalciferol (VITAMIN D) 1000 UNITS tablet Take 2,000 Units by mouth daily.     . MULTIPLE VITAMINS PO Take 1 capsule by mouth daily.    . Multiple Vitamins-Minerals (PRESERVISION AREDS 2) CAPS Take 2 capsules by mouth daily.    Marland Kitchen tiZANidine (ZANAFLEX) 2 MG tablet Take 1-2 tablets (2-4 mg total) by mouth every 6 (six) hours as needed for muscle spasms. 30 tablet 0   No facility-administered medications prior to visit.    No Known Allergies  ROS Review of Systems    Objective:    Physical Exam Constitutional:      Appearance: She is well-developed.  HENT:     Head: Normocephalic and atraumatic.  Cardiovascular:     Rate and Rhythm: Normal rate and regular rhythm.     Heart sounds: Normal heart sounds.  Pulmonary:     Effort: Pulmonary effort is normal.     Breath sounds: Normal breath sounds.  Abdominal:     General: Abdomen is flat.     Palpations: Abdomen is soft.   Skin:    General: Skin is warm and dry.  Neurological:     Mental Status: She is alert and oriented to person, place, and time.  Psychiatric:        Behavior: Behavior normal.     Ht 5\' 2"  (1.575 m)   Wt 160 lb (72.6 kg)   BMI 29.26 kg/m  Wt Readings from Last 3 Encounters:  08/21/20 160 lb (72.6 kg)  08/19/20 161 lb (73 kg)  07/16/20 160 lb (72.6 kg)     Health Maintenance Due  Topic Date Due  . Hepatitis C Screening  Never done    There are no preventive care reminders to display for this patient.  Lab Results  Component Value Date   TSH 1.74 06/21/2020   Lab Results  Component Value Date   WBC 9.0 06/21/2020   HGB 13.9 06/21/2020   HCT 41.0 06/21/2020   MCV 97.2 06/21/2020   PLT 299 06/21/2020   Lab Results  Component Value Date   NA 142 06/21/2020   K 5.2 06/21/2020   CO2 26 06/21/2020   GLUCOSE 89 06/21/2020   BUN 16 06/21/2020   CREATININE 0.97 (H) 06/21/2020   BILITOT 1.6 (H) 06/21/2020   ALKPHOS 58 03/30/2017   AST 15 06/21/2020   ALT 4 (L) 06/21/2020   PROT 6.8 06/21/2020   ALBUMIN 4.3 03/30/2017   CALCIUM 9.3 06/21/2020   Lab Results  Component Value Date   CHOL 209 (H) 06/21/2020   Lab Results  Component Value Date   HDL 50 06/21/2020   Lab Results  Component Value Date   LDLCALC 133 (H) 06/21/2020   Lab Results  Component Value Date   TRIG 143 06/21/2020   Lab Results  Component Value Date   CHOLHDL 4.2 06/21/2020   Lab Results  Component Value Date   HGBA1C 5.4 06/21/2020      Assessment & Plan:   Problem List Items Addressed This Visit      Other   Seizure ([place])    Single new onset seizure of unclear etiology it would be highly unusual for this to be from a fairly uncomplicated UTI.  I do think this needs further evaluation.  Please let us know if she has any recurrence.  Otherwise keep appointment with neurology in December.  Family history of malignant neoplasm of pancreas    With recent elevated lipase  level which is a little unusual my recheck that and see if it is normalized or if it still elevated if it is we will do a CA 19-9.  CT did not reveal any cyst or abnormalities on the scan but could consider further work-up with MRI if needed.      Relevant Orders   Lipase (Completed)   CK (Creatine Kinase) (Completed)   Cancer antigen 19-9   Elevated CK - Primary    I would go ahead and recheck the CK enzyme just to make sure that it has completely normalized especially with persistent soreness make sure she is not still experiencing some excess muscle breakdown.      Relevant Orders   Lipase (Completed)   CK (Creatine Kinase) (Completed)   Cancer antigen 19-9   Atypical chest pain    Chest is still quite uncomfortable but I really do feel like this is more musculoskeletal and she really describes it more as a soreness.  The muscle relaxer has only been fairly helpful.  She is also been using ice as that seems to help as well.  Continue conservative care.  Should gradually improve over the next week.      Relevant Orders   Lipase (Completed)   CK (Creatine Kinase) (Completed)   Cancer antigen 19-9    Other Visit Diagnoses    Elevated lipase       Relevant Orders   Lipase (Completed)   CK (Creatine Kinase) (Completed)   Cancer antigen 19-9      No orders of the defined types were placed in this encounter.   Follow-up: No follow-ups on file.    Beatrice Lecher, MD

## 2020-08-22 ENCOUNTER — Encounter: Payer: Self-pay | Admitting: Family Medicine

## 2020-08-22 ENCOUNTER — Telehealth: Payer: Self-pay | Admitting: Family Medicine

## 2020-08-22 DIAGNOSIS — Z800 Family history of malignant neoplasm of digestive organs: Secondary | ICD-10-CM | POA: Insufficient documentation

## 2020-08-22 DIAGNOSIS — R569 Unspecified convulsions: Secondary | ICD-10-CM | POA: Insufficient documentation

## 2020-08-22 DIAGNOSIS — I712 Thoracic aortic aneurysm, without rupture, unspecified: Secondary | ICD-10-CM

## 2020-08-22 DIAGNOSIS — R748 Abnormal levels of other serum enzymes: Secondary | ICD-10-CM | POA: Insufficient documentation

## 2020-08-22 DIAGNOSIS — R0789 Other chest pain: Secondary | ICD-10-CM | POA: Insufficient documentation

## 2020-08-22 LAB — LIPASE: Lipase: 80 U/L — ABNORMAL HIGH (ref 7–60)

## 2020-08-22 LAB — CK: Total CK: 68 U/L (ref 29–143)

## 2020-08-22 LAB — CANCER ANTIGEN 19-9: CA 19-9: 9 U/mL (ref <34)

## 2020-08-22 NOTE — Assessment & Plan Note (Addendum)
Chest is still quite uncomfortable but I really do feel like this is more musculoskeletal and she really describes it more as a soreness.  The muscle relaxer has only been fairly helpful.  She is also been using ice as that seems to help as well.  Continue conservative care.  Should gradually improve over the next week.

## 2020-08-22 NOTE — Assessment & Plan Note (Signed)
With recent elevated lipase level which is a little unusual my recheck that and see if it is normalized or if it still elevated if it is we will do a CA 19-9.  CT did not reveal any cyst or abnormalities on the scan but could consider further work-up with MRI if needed.

## 2020-08-22 NOTE — Assessment & Plan Note (Signed)
I would go ahead and recheck the CK enzyme just to make sure that it has completely normalized especially with persistent soreness make sure she is not still experiencing some excess muscle breakdown.

## 2020-08-22 NOTE — Assessment & Plan Note (Signed)
Single new onset seizure of unclear etiology it would be highly unusual for this to be from a fairly uncomplicated UTI.  I do think this needs further evaluation.  Please let us know if she has any recurrence.  Otherwise keep appointment with neurology in December.

## 2020-08-22 NOTE — Telephone Encounter (Signed)
Please call imaging at [facility].  Need to correct CT of chest. Says aneurysm of ureter and I think it supposed to say or aortic aneurysm but previous  Verify with them because I need to know if she needs additional follow-up.

## 2020-08-27 ENCOUNTER — Other Ambulatory Visit: Payer: Self-pay | Admitting: *Deleted

## 2020-08-27 DIAGNOSIS — R748 Abnormal levels of other serum enzymes: Secondary | ICD-10-CM

## 2020-08-28 NOTE — Telephone Encounter (Signed)
Called and spoke with Vicente Males in Radiology dept [phone]). Dr Laurin Coder who did this is out this week but she is going to try to contact another radiologist to review and addend the report.   I provided my direct call back number and asked for confirmation for when this report has been updated

## 2020-09-05 NOTE — Telephone Encounter (Signed)
Called to check status, spoke with Ivin Booty in radiology this time.   Addendum has not been done, she is going to try to reach someone to correct this. She was provided my direct call back number and asked to let me know once this has been completed. FYI update to Dr Madilyn Fireman

## 2020-09-10 NOTE — Telephone Encounter (Signed)
Please call patient and let her know that we did get the radiologist at [facility] to update the CT results.  There was a typo and we just need to clarify so that we could make sure we were providing the appropriate follow-up.  She does have a 4.5 x 4 cm aneurysm of the thoracic aorta.  Based on the size of this we need to repeat measure this again in 1 year we can usually do that with an ultrasound.

## 2020-09-10 NOTE — Telephone Encounter (Signed)
  Addendum  Addendum by Zettie Pho, MD on 09/07/2020 2:45 AM EST  The report should read:   COMPARISON: 08/17/2020   INDICATION: Thoracic aortic aneurysm (TAA) suspected   TECHNIQUE: CT ANGIO CHEST/ABDOMEN/PELVIS   CONTRAST: 70 mL Isovue-370,   3-D reformats were generated and reviewed on an independent workstation  and saved to PACS   Dose reduction was utilized (automated exposure control, mA or kV  adjustment based on patient size, or iterative image reconstruction).   FINDINGS:    CTA CHEST:   Lower neck: Unremarkable   Lungs: Mild bilateral dependent and basilar subsegmental atelectasis. No  suspicious acute pulmonary infiltrate identified.   Cardiovascular: Unremarkable   Thoracic aorta: 4.5 x 4 cm ascending thoracic aortic aneurysm. Negative  for dissection.    Pulmonary arteries: Negative for acute pulmonary embolus.   Mediastinum: Unremarkable   Lymph nodes: Unremarkable   Pleura: No pleural effusions. No pneumothorax.   Chest walls: Unremarkable   Bones: Old moderateT4 superior endplate compression fracture.

## 2020-09-11 NOTE — Telephone Encounter (Signed)
Called patient, no answer, no VM picked up

## 2020-09-19 NOTE — Telephone Encounter (Signed)
I tried to call patient. Phone rang and then hung up.

## 2020-09-26 DIAGNOSIS — I712 Thoracic aortic aneurysm, without rupture: Secondary | ICD-10-CM | POA: Diagnosis not present

## 2020-09-26 DIAGNOSIS — R569 Unspecified convulsions: Secondary | ICD-10-CM | POA: Diagnosis not present

## 2020-09-30 DIAGNOSIS — R29818 Other symptoms and signs involving the nervous system: Secondary | ICD-10-CM | POA: Diagnosis not present

## 2020-09-30 DIAGNOSIS — R569 Unspecified convulsions: Secondary | ICD-10-CM | POA: Diagnosis not present

## 2020-10-04 DIAGNOSIS — H2511 Age-related nuclear cataract, right eye: Secondary | ICD-10-CM | POA: Diagnosis not present

## 2020-10-04 DIAGNOSIS — H353124 Nonexudative age-related macular degeneration, left eye, advanced atrophic with subfoveal involvement: Secondary | ICD-10-CM | POA: Diagnosis not present

## 2020-10-04 DIAGNOSIS — H353213 Exudative age-related macular degeneration, right eye, with inactive scar: Secondary | ICD-10-CM | POA: Diagnosis not present

## 2020-10-04 DIAGNOSIS — H43813 Vitreous degeneration, bilateral: Secondary | ICD-10-CM | POA: Diagnosis not present

## 2020-10-22 DIAGNOSIS — R569 Unspecified convulsions: Secondary | ICD-10-CM | POA: Diagnosis not present

## 2020-11-14 ENCOUNTER — Ambulatory Visit (INDEPENDENT_AMBULATORY_CARE_PROVIDER_SITE_OTHER): Payer: Medicare Other

## 2020-11-14 ENCOUNTER — Encounter: Payer: Self-pay | Admitting: Family Medicine

## 2020-11-14 ENCOUNTER — Other Ambulatory Visit: Payer: Self-pay

## 2020-11-14 ENCOUNTER — Ambulatory Visit (INDEPENDENT_AMBULATORY_CARE_PROVIDER_SITE_OTHER): Payer: Medicare Other | Admitting: Family Medicine

## 2020-11-14 VITALS — BP 131/74 | HR 80 | Ht 62.0 in | Wt 160.0 lb

## 2020-11-14 DIAGNOSIS — M419 Scoliosis, unspecified: Secondary | ICD-10-CM | POA: Diagnosis not present

## 2020-11-14 DIAGNOSIS — R748 Abnormal levels of other serum enzymes: Secondary | ICD-10-CM

## 2020-11-14 DIAGNOSIS — M549 Dorsalgia, unspecified: Secondary | ICD-10-CM

## 2020-11-14 DIAGNOSIS — S22049A Unspecified fracture of fourth thoracic vertebra, initial encounter for closed fracture: Secondary | ICD-10-CM

## 2020-11-14 DIAGNOSIS — I712 Thoracic aortic aneurysm, without rupture, unspecified: Secondary | ICD-10-CM

## 2020-11-14 DIAGNOSIS — N1831 Chronic kidney disease, stage 3a: Secondary | ICD-10-CM | POA: Diagnosis not present

## 2020-11-14 DIAGNOSIS — S22040A Wedge compression fracture of fourth thoracic vertebra, initial encounter for closed fracture: Secondary | ICD-10-CM | POA: Diagnosis not present

## 2020-11-14 LAB — CBC WITH DIFFERENTIAL/PLATELET
Eosinophils Absolute: 200 cells/uL (ref 15–500)
Lymphs Abs: 2204 cells/uL (ref 850–3900)
Platelets: 305 10*3/uL (ref 140–400)

## 2020-11-14 MED ORDER — VALACYCLOVIR HCL 1 G PO TABS: 1000.0000 mg | ORAL_TABLET | Freq: Three times a day (TID) | ORAL | 0 refills | Status: AC

## 2020-11-14 MED ORDER — PREDNISONE 20 MG PO TABS: 40.0000 mg | ORAL_TABLET | Freq: Every day | ORAL | 0 refills | Status: DC

## 2020-11-14 NOTE — Patient Instructions (Signed)
Increase Gabapentin to 2 tabs at bedtime and 1 in AM and 1 around noon.   We will try the prednisone as well. Let me know if helping or not.   Please fill the prescription for valacyclovir if you notice a rash.

## 2020-11-14 NOTE — Progress Notes (Signed)
Established Patient Office Visit  Subjective:  Patient ID: Sherri Allison, female    DOB: 1941/08/28  Age: 80 y.o. MRN: [id number]  CC:  Chief Complaint  Patient presents with  . Follow-up    HPI Sherri Allison presents for follow-up for episode back in October where she had what she felt was seizure-like activity and was diagnosed with acute cystitis.  She was also having significant back pain at that time that was more upper and into her chest.  She is out like maybe it was just sore muscles from the seizure activity she has followed up with neurology, Dr. Cammy Brochure since that seizure and had an EEG which was normal.  She says that not long after she saw me in the office on November 3 she actually broke out with a blistery rash on her side on that right side where she was actually having pain over her back and anterior chest wall.  Past Medical History:  Diagnosis Date  . Fibroids    Seen on ultrasound in 2006.  . Macular degeneration   . Normal cardiac stress test    2006    Past Surgical History:  Procedure Laterality Date  . ABDOMINAL HYSTERECTOMY    . APPENDECTOMY    . BACK SURGERY    . discketcomy    . INCONTINENCE SURGERY      Family History  Problem Relation Age of Onset  . Prostate cancer Brother   . Fibromyalgia Sister   . Pancreatic cancer Mother     Social History   Socioeconomic History  . Marital status: Married    Spouse name: Iona Beard  . Number of children: 2   . Years of education: Not on file  . Highest education level: Not on file  Occupational History  . Occupation: Retired Quarry manager  Tobacco Use  . Smoking status: Never Smoker  . Smokeless tobacco: Never Used  Vaping Use  . Vaping Use: Never used  Substance and Sexual Activity  . Alcohol use: No  . Drug use: No  . Sexual activity: Not on file  Other Topics Concern  . Not on file  Social History Narrative   Planning on joining gym with her sister to add swimming.     Social Determinants  of Health   Financial Resource Strain: Not on file  Food Insecurity: Not on file  Transportation Needs: Not on file  Physical Activity: Not on file  Stress: Not on file  Social Connections: Not on file  Intimate Partner Violence: Not on file    Outpatient Medications Prior to Visit  Medication Sig Dispense Refill  . atorvastatin (LIPITOR) 20 MG tablet Take 1 tablet (20 mg total) by mouth at bedtime. 90 tablet 3  . cholecalciferol (VITAMIN D) 1000 UNITS tablet Take 2,000 Units by mouth daily.     Marland Kitchen gabapentin (NEURONTIN) 100 MG capsule Take 100 mg by mouth in the morning, at noon, and at bedtime.  5  . MULTIPLE VITAMINS PO Take 1 capsule by mouth daily.    . Multiple Vitamins-Minerals (PRESERVISION AREDS 2) CAPS Take 2 capsules by mouth daily.    Marland Kitchen tiZANidine (ZANAFLEX) 2 MG tablet Take 1-2 tablets (2-4 mg total) by mouth every 6 (six) hours as needed for muscle spasms. 30 tablet 0   No facility-administered medications prior to visit.    No Known Allergies  ROS Review of Systems    Objective:    Physical Exam  BP 131/74  Pulse 80   Ht 5\' 2"  (1.575 m)   Wt 160 lb (72.6 kg)   SpO2 98%   BMI 29.26 kg/m  Wt Readings from Last 3 Encounters:  11/14/20 160 lb (72.6 kg)  08/21/20 160 lb (72.6 kg)  08/19/20 161 lb (73 kg)     Health Maintenance Due  Topic Date Due  . Hepatitis C Screening  Never done  . COVID-19 Vaccine (2 - Booster for Janssen series) 03/25/2020    There are no preventive care reminders to display for this patient.  Lab Results  Component Value Date   TSH 1.74 06/21/2020   Lab Results  Component Value Date   WBC 9.0 06/21/2020   HGB 13.9 06/21/2020   HCT 41.0 06/21/2020   MCV 97.2 06/21/2020   PLT 299 06/21/2020   Lab Results  Component Value Date   NA 142 06/21/2020   K 5.2 06/21/2020   CO2 26 06/21/2020   GLUCOSE 89 06/21/2020   BUN 16 06/21/2020   CREATININE 0.97 (H) 06/21/2020   BILITOT 1.6 (H) 06/21/2020   ALKPHOS 58  03/30/2017   AST 15 06/21/2020   ALT 4 (L) 06/21/2020   PROT 6.8 06/21/2020   ALBUMIN 4.3 03/30/2017   CALCIUM 9.3 06/21/2020   Lab Results  Component Value Date   CHOL 209 (H) 06/21/2020   Lab Results  Component Value Date   HDL 50 06/21/2020   Lab Results  Component Value Date   LDLCALC 133 (H) 06/21/2020   Lab Results  Component Value Date   TRIG 143 06/21/2020   Lab Results  Component Value Date   CHOLHDL 4.2 06/21/2020   Lab Results  Component Value Date   HGBA1C 5.4 06/21/2020      Assessment & Plan:   Problem List Items Addressed This Visit      Cardiovascular and Mediastinum   Thoracic aortic aneurysm ([place]) - Primary     Genitourinary   CKD (chronic kidney disease) stage 3, GFR 30-59 ml/min ([facility]CC)   Relevant Orders   Lipase   CBC with Differential/Platelet    Other Visit Diagnoses    Elevated lipase       Relevant Orders   Lipase   CBC with Differential/Platelet   Upper back pain       Relevant Medications   predniSONE (DELTASONE) 20 MG tablet   Other Relevant Orders   Lipase   CBC with Differential/Platelet   DG Thoracic Spine W/Swimmers      Upper back pain-I think the pain that she is having on the right now the rash is resolved is most consistent with postherpetic neuralgia we discussed increasing her gabapentin by an extra tab at bedtime.   Her symptoms are really quite interesting she has had pain and what sounds like classic shingles on her right upper back near the shoulder blade wrapping around to her front chest and up to the nipple area that seems to have resolved but now in the last 2 weeks she has had exact same pain but just on the opposite side but without any rash.  She says it exactly mimics the pain she is having on her right side.  She does have an old chronic T4 vertebral fracture.  She does not remember any trauma injury or heavy lifting recently.  We discussed maybe a trial of prednisone to see if maybe this is a disc issue  and if she gets relief with the prednisone.  In the meantime we will check  a CBC.  Elevated lipase-her lipase was significantly elevated around the time that she had the seizure activity.  We did do a CA 19-9 at that time which was normal and she was supposed return to have the lipase rechecked but did not so we will repeat that today just to make sure that left-sided upper chest pain is not at all related to her pancreas if it is still elevated then consider MRI for further work-up.  Meds ordered this encounter  Medications  . predniSONE (DELTASONE) 20 MG tablet    Sig: Take 2 tablets (40 mg total) by mouth daily with breakfast.    Dispense:  10 tablet    Refill:  0  . valACYclovir (VALTREX) 1000 MG tablet    Sig: Take 1 tablet (1,000 mg total) by mouth 3 (three) times daily for 7 days.    Dispense:  21 tablet    Refill:  0    Follow-up: No follow-ups on file.    Beatrice Lecher, MD     Old moderate T4 superior endplate compression fracture.

## 2020-11-15 LAB — CBC WITH DIFFERENTIAL/PLATELET
Absolute Monocytes: 561 cells/uL (ref 200–950)
Basophils Absolute: 29 cells/uL (ref 0–200)
Basophils Relative: 0.3 %
Eosinophils Relative: 2.1 %
HCT: 41.7 % (ref 35.0–45.0)
Hemoglobin: 14.4 g/dL (ref 11.7–15.5)
MCH: 32.7 pg (ref 27.0–33.0)
MCHC: 34.5 g/dL (ref 32.0–36.0)
MCV: 94.6 fL (ref 80.0–100.0)
MPV: 11 fL (ref 7.5–12.5)
Monocytes Relative: 5.9 %
Neutro Abs: 6508 cells/uL (ref 1500–7800)
Neutrophils Relative %: 68.5 %
RBC: 4.41 10*6/uL (ref 3.80–5.10)
RDW: 12.2 % (ref 11.0–15.0)
Total Lymphocyte: 23.2 %
WBC: 9.5 10*3/uL (ref 3.8–10.8)

## 2020-11-15 LAB — LIPASE: Lipase: 73 U/L — ABNORMAL HIGH (ref 7–60)

## 2020-11-17 ENCOUNTER — Encounter: Payer: Self-pay | Admitting: Family Medicine

## 2020-11-17 DIAGNOSIS — R748 Abnormal levels of other serum enzymes: Secondary | ICD-10-CM

## 2020-11-18 ENCOUNTER — Encounter: Payer: Self-pay | Admitting: Family Medicine

## 2020-11-20 NOTE — Addendum Note (Signed)
Addended by: Beatrice Lecher D on: 11/20/2020 08:22 AM   Modules accepted: Orders

## 2020-11-20 NOTE — Telephone Encounter (Signed)
Can you call imaging and see if I need an MRI with contrast to look at pancreas?

## 2020-11-21 NOTE — Addendum Note (Signed)
Addended by: Beatrice Lecher D on: 11/21/2020 05:39 PM   Modules accepted: Orders

## 2020-11-21 NOTE — Telephone Encounter (Signed)
MRI ordered

## 2020-11-25 ENCOUNTER — Encounter: Payer: Self-pay | Admitting: Family Medicine

## 2020-11-25 ENCOUNTER — Other Ambulatory Visit: Payer: Self-pay

## 2020-11-25 ENCOUNTER — Ambulatory Visit (INDEPENDENT_AMBULATORY_CARE_PROVIDER_SITE_OTHER): Payer: Medicare Other | Admitting: Family Medicine

## 2020-11-25 VITALS — BP 153/75 | HR 87 | Ht 62.0 in | Wt 160.0 lb

## 2020-11-25 DIAGNOSIS — M549 Dorsalgia, unspecified: Secondary | ICD-10-CM | POA: Diagnosis not present

## 2020-11-25 DIAGNOSIS — R748 Abnormal levels of other serum enzymes: Secondary | ICD-10-CM | POA: Diagnosis not present

## 2020-11-25 DIAGNOSIS — R42 Dizziness and giddiness: Secondary | ICD-10-CM

## 2020-11-25 DIAGNOSIS — R55 Syncope and collapse: Secondary | ICD-10-CM | POA: Diagnosis not present

## 2020-11-25 DIAGNOSIS — M546 Pain in thoracic spine: Secondary | ICD-10-CM | POA: Diagnosis not present

## 2020-11-25 DIAGNOSIS — T50905A Adverse effect of unspecified drugs, medicaments and biological substances, initial encounter: Secondary | ICD-10-CM | POA: Diagnosis not present

## 2020-11-25 MED ORDER — TRAMADOL HCL 50 MG PO TABS: 50.0000 mg | ORAL_TABLET | Freq: Three times a day (TID) | ORAL | 0 refills | Status: AC | PRN

## 2020-11-25 NOTE — Progress Notes (Signed)
Established Patient Office Visit  Subjective:  Patient ID: Sherri Allison, female    DOB: 09-13-1941  Age: 79 y.o. MRN: [id number]  CC:  Chief Complaint  Patient presents with  . Shoulder Pain  . passed out    HPI Sherri Allison presents for syncope.     We had increased her gabapentin prior to the episode of syncope and she was weaning back down off the medication.  Was at her hair dressers with her hair tilted back and thinks passed out.  She had taken her prednisone earlier that morning. They called EMS. EMS checked her sugar on 116. She never noticed the blisters.  She has now weaned herself completely off the gabapentin.  She says in general she just felt really dizzy and lightheaded on the gabapentin she is completely off of it now and she is also done with her prednisone but she still having a lot of pain radiating around from her left upper back underneath the axilla and right across the breast to the nipple on that left side.  Pain is sometimes worse with movement but not always.  And if she leans back and puts pressure on just a certain area below her shoulder blade it is more painful.  She has not noticed any rash or skin lesions.  Past Medical History:  Diagnosis Date  . Fibroids    Seen on ultrasound in 2006.  . Macular degeneration   . Normal cardiac stress test    2006    Past Surgical History:  Procedure Laterality Date  . ABDOMINAL HYSTERECTOMY    . APPENDECTOMY    . BACK SURGERY    . discketcomy    . INCONTINENCE SURGERY      Family History  Problem Relation Age of Onset  . Prostate cancer Brother   . Fibromyalgia Sister   . Pancreatic cancer Mother     Social History   Socioeconomic History  . Marital status: Married    Spouse name: Sherri Allison  . Number of children: 2   . Years of education: Not on file  . Highest education level: Not on file  Occupational History  . Occupation: Retired Quarry manager  Tobacco Use  . Smoking status: Never Smoker  . Smokeless  tobacco: Never Used  Vaping Use  . Vaping Use: Never used  Substance and Sexual Activity  . Alcohol use: No  . Drug use: No  . Sexual activity: Not on file  Other Topics Concern  . Not on file  Social History Narrative   Planning on joining gym with her sister to add swimming.     Social Determinants of Health   Financial Resource Strain: Not on file  Food Insecurity: Not on file  Transportation Needs: Not on file  Physical Activity: Not on file  Stress: Not on file  Social Connections: Not on file  Intimate Partner Violence: Not on file    Outpatient Medications Prior to Visit  Medication Sig Dispense Refill  . atorvastatin (LIPITOR) 20 MG tablet Take 1 tablet (20 mg total) by mouth at bedtime. 90 tablet 3  . cholecalciferol (VITAMIN D) 1000 UNITS tablet Take 2,000 Units by mouth daily.     . MULTIPLE VITAMINS PO Take 1 capsule by mouth daily.    . Multiple Vitamins-Minerals (PRESERVISION AREDS 2) CAPS Take 2 capsules by mouth daily.    Marland Kitchen gabapentin (NEURONTIN) 100 MG capsule Take 100 mg by mouth in the morning, at noon, and at bedtime.  5  . predniSONE (DELTASONE) 20 MG tablet Take 2 tablets (40 mg total) by mouth daily with breakfast. 10 tablet 0   No facility-administered medications prior to visit.    No Known Allergies  ROS Review of Systems    Objective:    Physical Exam Constitutional:      Appearance: She is well-developed and well-nourished.  HENT:     Head: Normocephalic and atraumatic.  Cardiovascular:     Rate and Rhythm: Normal rate and regular rhythm.     Heart sounds: Normal heart sounds.  Pulmonary:     Effort: Pulmonary effort is normal.     Breath sounds: Normal breath sounds.  Musculoskeletal:     Comments: Nontender over the thoracic spine.  No rash on the skin.  Skin:    General: Skin is warm and dry.  Neurological:     Mental Status: She is alert and oriented to person, place, and time.  Psychiatric:        Mood and Affect: Mood and  affect normal.        Behavior: Behavior normal.     BP (!) 153/75   Pulse 87   Ht 5\' 2"  (1.575 m)   Wt 160 lb (72.6 kg)   SpO2 99%   BMI 29.26 kg/m  Wt Readings from Last 3 Encounters:  11/25/20 160 lb (72.6 kg)  11/14/20 160 lb (72.6 kg)  08/21/20 160 lb (72.6 kg)     Health Maintenance Due  Topic Date Due  . Hepatitis C Screening  Never done  . COVID-19 Vaccine (2 - Booster for Janssen series) 03/25/2020    There are no preventive care reminders to display for this patient.  Lab Results  Component Value Date   TSH 1.74 06/21/2020   Lab Results  Component Value Date   WBC 9.5 11/14/2020   HGB 14.4 11/14/2020   HCT 41.7 11/14/2020   MCV 94.6 11/14/2020   PLT 305 11/14/2020   Lab Results  Component Value Date   NA 142 06/21/2020   K 5.2 06/21/2020   CO2 26 06/21/2020   GLUCOSE 89 06/21/2020   BUN 16 06/21/2020   CREATININE 0.97 (H) 06/21/2020   BILITOT 1.6 (H) 06/21/2020   ALKPHOS 58 03/30/2017   AST 15 06/21/2020   ALT 4 (L) 06/21/2020   PROT 6.8 06/21/2020   ALBUMIN 4.3 03/30/2017   CALCIUM 9.3 06/21/2020   Lab Results  Component Value Date   CHOL 209 (H) 06/21/2020   Lab Results  Component Value Date   HDL 50 06/21/2020   Lab Results  Component Value Date   LDLCALC 133 (H) 06/21/2020   Lab Results  Component Value Date   TRIG 143 06/21/2020   Lab Results  Component Value Date   CHOLHDL 4.2 06/21/2020   Lab Results  Component Value Date   HGBA1C 5.4 06/21/2020      Assessment & Plan:   Problem List Items Addressed This Visit   None   Visit Diagnoses    Upper back pain    -  Primary   Relevant Medications   traMADol (ULTRAM) 50 MG tablet   Acute left-sided thoracic back pain       Relevant Medications   traMADol (ULTRAM) 50 MG tablet   Dizziness       Medication side effect, initial encounter       Syncope, unspecified syncope type       Elevated lipase  Left-sided pain that follows right along the dermatome at  the breast level.  Consider could be a pinched nerve coming from a disc issue in her spine still could be related to the shingles even though it is on the opposite side.  I want her to go ahead and try the antiviral I do not think it would hurt and it might actually help in the meantime I gave her a prescription for tramadol but did warn about potential for sedation since the gabapentin caused dizziness and lightheadedness.  Also okay to continue to use extra strength Tylenol.  Syncope-I think this was secondary to the gabapentin plus her head was tilted back getting washed in the ball at the hairdressers.  She has not had any more dizzy episode since coming off the gabapentin.  Blood pressures here look great.  No orthostasis in fact her blood pressure is a little borderline elevated today.  Elevated lipase-still has persistently elevated level and her pancreas is on the left side which is where she is hurting so I really like to move forward with an MRI of the pancreas for further work-up the order has been placed and I did verify that we did get approval to get her scheduled.  The imaging department will be contacting her in the next day or 2 to schedule.  Meds ordered this encounter  Medications  . traMADol (ULTRAM) 50 MG tablet    Sig: Take 1 tablet (50 mg total) by mouth every 8 (eight) hours as needed for up to 5 days.    Dispense:  15 tablet    Refill:  0    Follow-up: No follow-ups on file.    Beatrice Lecher, MD

## 2020-11-25 NOTE — Patient Instructions (Signed)
Start the antiviral medication.  You can try the tramadol for pain control and see how you feel

## 2020-12-09 ENCOUNTER — Ambulatory Visit: Payer: Medicare Other

## 2020-12-09 ENCOUNTER — Other Ambulatory Visit: Payer: Self-pay

## 2020-12-09 DIAGNOSIS — K859 Acute pancreatitis without necrosis or infection, unspecified: Secondary | ICD-10-CM | POA: Diagnosis not present

## 2020-12-09 DIAGNOSIS — R748 Abnormal levels of other serum enzymes: Secondary | ICD-10-CM

## 2020-12-09 DIAGNOSIS — K7689 Other specified diseases of liver: Secondary | ICD-10-CM | POA: Diagnosis not present

## 2020-12-09 MED ORDER — GADOBUTROL 1 MMOL/ML IV SOLN
7.2000 mL | Freq: Once | INTRAVENOUS | Status: AC | PRN
  Administered 2020-12-09: 7.5 mL via INTRAVENOUS

## 2020-12-10 ENCOUNTER — Encounter: Payer: Self-pay | Admitting: Family Medicine

## 2020-12-10 ENCOUNTER — Ambulatory Visit (INDEPENDENT_AMBULATORY_CARE_PROVIDER_SITE_OTHER): Payer: Medicare Other | Admitting: Sports Medicine

## 2020-12-10 ENCOUNTER — Ambulatory Visit (INDEPENDENT_AMBULATORY_CARE_PROVIDER_SITE_OTHER): Payer: Medicare Other

## 2020-12-10 DIAGNOSIS — M5413 Radiculopathy, cervicothoracic region: Secondary | ICD-10-CM | POA: Diagnosis not present

## 2020-12-10 DIAGNOSIS — M2578 Osteophyte, vertebrae: Secondary | ICD-10-CM | POA: Diagnosis not present

## 2020-12-10 DIAGNOSIS — M810 Age-related osteoporosis without current pathological fracture: Secondary | ICD-10-CM | POA: Diagnosis not present

## 2020-12-10 DIAGNOSIS — M47812 Spondylosis without myelopathy or radiculopathy, cervical region: Secondary | ICD-10-CM | POA: Diagnosis not present

## 2020-12-10 DIAGNOSIS — M25512 Pain in left shoulder: Secondary | ICD-10-CM | POA: Diagnosis not present

## 2020-12-10 DIAGNOSIS — M25511 Pain in right shoulder: Secondary | ICD-10-CM | POA: Diagnosis not present

## 2020-12-10 MED ORDER — TRAMADOL HCL 50 MG PO TABS: ORAL_TABLET | ORAL | 1 refills | Status: DC

## 2020-12-10 MED ORDER — PREGABALIN 50 MG PO CAPS: ORAL_CAPSULE | ORAL | 3 refills | Status: DC

## 2020-12-10 NOTE — Assessment & Plan Note (Signed)
This is a very pleasant 80 year old female with a fairly complex medical history, she had a generalized tonic-clonic seizure about 4 months ago, she was seen in the emergency department, as well as by a neurologist, ultimately she was noted to have a T4 vertebral compression fracture with moderate wedging. She was started on gabapentin by her neurologist. Unfortunately she developed shingles thereafter, right-sided T4 distribution across the back and at the level of the breast. This improved considerably, more recently she developed pain in the left periscapular region, radiating from the top of the medial scapula around under her axilla. On exam she really does not have any tenderness on her spine, to palpation or percussion particularly at the T4 spinous process. My suspicion is that this is a left-sided cervical radiculitis, C5 or C6. She at this point has had greater than 6 weeks of physician directed conservative treatment including pain medications, gabapentin at a dose of 400 total milligrams per day, physical therapy. Given to proceed with x-rays of her cervical spine, we already have x-rays of her thoracic spine, I would like an MRI of her cervical and thoracic spine. Switching from gabapentin to Lyrica as she is getting excessively sedated, and refilling her tramadol in the meantime.

## 2020-12-10 NOTE — Progress Notes (Signed)
    Procedures performed today:    None.  Independent interpretation of notes and tests performed by another provider:   None.  Brief History, Exam, Impression, and Recommendations:    Radiculopathy of cervicothoracic region This is a very pleasant 81 year old female with a fairly complex medical history, she had a generalized tonic-clonic seizure about 4 months ago, she was seen in the emergency department, as well as by a neurologist, ultimately she was noted to have a T4 vertebral compression fracture with moderate wedging. She was started on gabapentin by her neurologist. Unfortunately she developed shingles thereafter, right-sided T4 distribution across the back and at the level of the breast. This improved considerably, more recently she developed pain in the left periscapular region, radiating from the top of the medial scapula around under her axilla. On exam she really does not have any tenderness on her spine, to palpation or percussion particularly at the T4 spinous process. My suspicion is that this is a left-sided cervical radiculitis, C5 or C6. She at this point has had greater than 6 weeks of physician directed conservative treatment including pain medications, gabapentin at a dose of 400 total milligrams per day, physical therapy. Given to proceed with x-rays of her cervical spine, we already have x-rays of her thoracic spine, I would like an MRI of her cervical and thoracic spine. Switching from gabapentin to Lyrica as she is getting excessively sedated, and refilling her tramadol in the meantime.   Osteoporosis Most recent bone density test approximately 5 or 6 years ago showed a T score of -2.4, however combined with a vertebral compression fracture this confirms a diagnosis of osteoporosis. We will get an updated bone density test and treat her likely with Prolia if confirms osteoporosis.    ___________________________________________ Gwen Her. Dianah Field, M.D.,  ABFM., CAQSM. Primary Care and [place] Instructor of [place] [employer] [facility] of [employer]

## 2020-12-10 NOTE — Assessment & Plan Note (Signed)
Most recent bone density test approximately 5 or 6 years ago showed a T score of -2.4, however combined with a vertebral compression fracture this confirms a diagnosis of osteoporosis. We will get an updated bone density test and treat her likely with Prolia if confirms osteoporosis.

## 2020-12-11 ENCOUNTER — Ambulatory Visit: Payer: Medicare Other

## 2020-12-11 ENCOUNTER — Other Ambulatory Visit: Payer: Self-pay

## 2020-12-14 ENCOUNTER — Other Ambulatory Visit: Payer: Medicare Other

## 2020-12-15 ENCOUNTER — Ambulatory Visit (INDEPENDENT_AMBULATORY_CARE_PROVIDER_SITE_OTHER): Payer: Medicare Other

## 2020-12-15 ENCOUNTER — Other Ambulatory Visit: Payer: Self-pay

## 2020-12-15 DIAGNOSIS — M40204 Unspecified kyphosis, thoracic region: Secondary | ICD-10-CM | POA: Diagnosis not present

## 2020-12-15 DIAGNOSIS — S22040A Wedge compression fracture of fourth thoracic vertebra, initial encounter for closed fracture: Secondary | ICD-10-CM | POA: Diagnosis not present

## 2020-12-15 DIAGNOSIS — M5413 Radiculopathy, cervicothoracic region: Secondary | ICD-10-CM

## 2020-12-15 DIAGNOSIS — M5124 Other intervertebral disc displacement, thoracic region: Secondary | ICD-10-CM | POA: Diagnosis not present

## 2020-12-15 DIAGNOSIS — M4802 Spinal stenosis, cervical region: Secondary | ICD-10-CM | POA: Diagnosis not present

## 2020-12-17 DIAGNOSIS — M5413 Radiculopathy, cervicothoracic region: Secondary | ICD-10-CM

## 2020-12-19 ENCOUNTER — Other Ambulatory Visit: Payer: Self-pay

## 2020-12-19 ENCOUNTER — Ambulatory Visit
Admission: RE | Admit: 2020-12-19 | Discharge: 2020-12-19 | Disposition: A | Discharge status: Home / Self Care | Payer: Medicare Other | Source: Ambulatory Visit | Attending: Sports Medicine | Admitting: Sports Medicine

## 2020-12-19 DIAGNOSIS — M5413 Radiculopathy, cervicothoracic region: Secondary | ICD-10-CM

## 2020-12-19 DIAGNOSIS — M5412 Radiculopathy, cervical region: Secondary | ICD-10-CM | POA: Diagnosis not present

## 2020-12-19 MED ORDER — IOPAMIDOL (ISOVUE-M 300) INJECTION 61%
1.0000 mL | Freq: Once | INTRAMUSCULAR | Status: AC | PRN
  Administered 2020-12-19: 1 mL via EPIDURAL

## 2020-12-19 MED ORDER — TRIAMCINOLONE ACETONIDE 40 MG/ML IJ SUSP (RADIOLOGY)
60.0000 mg | Freq: Once | INTRAMUSCULAR | Status: AC
  Administered 2020-12-19: 60 mg via EPIDURAL

## 2020-12-19 NOTE — Discharge Instructions (Signed)

## 2020-12-25 ENCOUNTER — Ambulatory Visit (INDEPENDENT_AMBULATORY_CARE_PROVIDER_SITE_OTHER): Payer: Medicare Other

## 2020-12-25 ENCOUNTER — Other Ambulatory Visit: Payer: Self-pay

## 2020-12-25 DIAGNOSIS — Z780 Asymptomatic menopausal state: Secondary | ICD-10-CM | POA: Diagnosis not present

## 2020-12-25 DIAGNOSIS — M810 Age-related osteoporosis without current pathological fracture: Secondary | ICD-10-CM | POA: Diagnosis not present

## 2020-12-29 ENCOUNTER — Encounter: Payer: Self-pay | Admitting: Family Medicine

## 2020-12-29 DIAGNOSIS — M810 Age-related osteoporosis without current pathological fracture: Secondary | ICD-10-CM

## 2021-01-02 NOTE — Telephone Encounter (Signed)
I would also recommend a bisphosphonate.  These are the standard of care for treatment of osteoporosis and typically we move to the other drugs if these are not well-tolerated.  Examples would be Fosamax which is 1 pill taken weekly or Boniva.  If she wants any additional information I am happy to schedule an appointment to go over all the treatment options currently available on the market.

## 2021-01-03 MED ORDER — ALENDRONATE SODIUM 70 MG PO TABS: 70.0000 mg | ORAL_TABLET | ORAL | 11 refills | Status: DC

## 2021-01-03 NOTE — Telephone Encounter (Signed)
rx sent

## 2021-01-31 ENCOUNTER — Encounter: Payer: Self-pay | Admitting: Family Medicine

## 2021-02-20 ENCOUNTER — Ambulatory Visit (INDEPENDENT_AMBULATORY_CARE_PROVIDER_SITE_OTHER): Payer: Medicare Other | Admitting: Family Medicine

## 2021-02-20 ENCOUNTER — Other Ambulatory Visit: Payer: Self-pay

## 2021-02-20 ENCOUNTER — Encounter: Payer: Self-pay | Admitting: Family Medicine

## 2021-02-20 VITALS — BP 138/72 | HR 78 | Ht 62.0 in | Wt 163.0 lb

## 2021-02-20 DIAGNOSIS — R569 Unspecified convulsions: Secondary | ICD-10-CM

## 2021-02-20 DIAGNOSIS — R55 Syncope and collapse: Secondary | ICD-10-CM | POA: Diagnosis not present

## 2021-02-20 NOTE — Progress Notes (Signed)
Established Patient Office Visit  Subjective:  Patient ID: Sherri Allison, female    DOB: [birth year]  Age: 80 y.o. MRN: [id number]  CC:  Chief Complaint  Patient presents with  . Follow-up    HPI Sherri Allison presents for   Pt reports that about 2 weeks ago while sleeping she had another seizure. She said that she began to shake and her breathing became labored so her husband turned her over to her side until this subsided. She never awakened she said that she could feel her self becoming dizzy during.    She was seen at [facility] on 08/17/2020 and diagnosed with a UTI, back spasm, and seizure activity. Her husband reports waking the prior evening around 10pm to find her lying in the bed, shaking with her arms up and very rigid. This lasted 1-2 minutes.  She had a normal EEG in January.  Gets episodes of severe dizziness and nausea when awake. Feels tingling and feels like she is going to pass out.  She says it comes over her in a wave.  And then she feels like her whole body is feeling flushed.  In fact it happened last month while she was at her hairdresser's and had her head tilted back getting her hair washed.  According to her hairdresser she actually passed out but she does not remember that herself.  Past Medical History:  Diagnosis Date  . Fibroids    Seen on ultrasound in 2006.  . Macular degeneration   . Normal cardiac stress test    2006    Past Surgical History:  Procedure Laterality Date  . ABDOMINAL HYSTERECTOMY    . APPENDECTOMY    . BACK SURGERY    . discketcomy    . INCONTINENCE SURGERY      Family History  Problem Relation Age of Onset  . Prostate cancer Brother   . Fibromyalgia Sister   . Pancreatic cancer Mother     Social History   Socioeconomic History  . Marital status: Married    Spouse name: Iona Beard  . Number of children: 2   . Years of education: Not on file  . Highest education level: Not on file  Occupational History  . Occupation:  Retired Quarry manager  Tobacco Use  . Smoking status: Never Smoker  . Smokeless tobacco: Never Used  Vaping Use  . Vaping Use: Never used  Substance and Sexual Activity  . Alcohol use: No  . Drug use: No  . Sexual activity: Not on file  Other Topics Concern  . Not on file  Social History Narrative   Planning on joining gym with her sister to add swimming.     Social Determinants of Health   Financial Resource Strain: Not on file  Food Insecurity: Not on file  Transportation Needs: Not on file  Physical Activity: Not on file  Stress: Not on file  Social Connections: Not on file  Intimate Partner Violence: Not on file    Outpatient Medications Prior to Visit  Medication Sig Dispense Refill  . alendronate (FOSAMAX) 70 MG tablet Take 1 tablet (70 mg total) by mouth every 7 (seven) days. Take with a full glass of water on an empty stomach. 4 tablet 11  . atorvastatin (LIPITOR) 20 MG tablet Take 1 tablet (20 mg total) by mouth at bedtime. 90 tablet 3  . cholecalciferol (VITAMIN D) 1000 UNITS tablet Take 2,000 Units by mouth daily.     . MULTIPLE VITAMINS PO Take  1 capsule by mouth daily.    . Multiple Vitamins-Minerals (PRESERVISION AREDS 2) CAPS Take 2 capsules by mouth daily.    . pregabalin (LYRICA) 50 MG capsule 1 capsule p.o. twice daily to 3 times daily 90 capsule 3  . traMADol (ULTRAM) 50 MG tablet 1/2-1 tab p.o. 3 times daily 30 tablet 1   No facility-administered medications prior to visit.    No Known Allergies  ROS Review of Systems    Objective:    Physical Exam Constitutional:      Appearance: She is well-developed.  HENT:     Head: Normocephalic and atraumatic.  Cardiovascular:     Rate and Rhythm: Normal rate and regular rhythm.     Heart sounds: Normal heart sounds.  Pulmonary:     Effort: Pulmonary effort is normal.     Breath sounds: Normal breath sounds.  Skin:    General: Skin is warm and dry.  Neurological:     Mental Status: She is alert and oriented  to person, place, and time.  Psychiatric:        Behavior: Behavior normal.     There were no vitals taken for this visit. Wt Readings from Last 3 Encounters:  11/25/20 160 lb (72.6 kg)  11/14/20 160 lb (72.6 kg)  08/21/20 160 lb (72.6 kg)     Health Maintenance Due  Topic Date Due  . Hepatitis C Screening  Never done  . COVID-19 Vaccine (2 - Booster for Janssen series) 03/25/2020    There are no preventive care reminders to display for this patient.  Lab Results  Component Value Date   TSH 1.74 06/21/2020   Lab Results  Component Value Date   WBC 9.5 11/14/2020   HGB 14.4 11/14/2020   HCT 41.7 11/14/2020   MCV 94.6 11/14/2020   PLT 305 11/14/2020   Lab Results  Component Value Date   NA 142 06/21/2020   K 5.2 06/21/2020   CO2 26 06/21/2020   GLUCOSE 89 06/21/2020   BUN 16 06/21/2020   CREATININE 0.97 (H) 06/21/2020   BILITOT 1.6 (H) 06/21/2020   ALKPHOS 58 03/30/2017   AST 15 06/21/2020   ALT 4 (L) 06/21/2020   PROT 6.8 06/21/2020   ALBUMIN 4.3 03/30/2017   CALCIUM 9.3 06/21/2020   Lab Results  Component Value Date   CHOL 209 (H) 06/21/2020   Lab Results  Component Value Date   HDL 50 06/21/2020   Lab Results  Component Value Date   LDLCALC 133 (H) 06/21/2020   Lab Results  Component Value Date   TRIG 143 06/21/2020   Lab Results  Component Value Date   CHOLHDL 4.2 06/21/2020   Lab Results  Component Value Date   HGBA1C 5.4 06/21/2020      Assessment & Plan:   Problem List Items Addressed This Visit   None   Visit Diagnoses    Syncope, unspecified syncope type    -  Primary   Relevant Orders   Cardiac event monitor   Seizure-like activity ([place])       Relevant Orders   Cardiac event monitor     Syncope-she definitely has distinct episodes of nearly passing out and feeling hot and flushed.  She has had a brain MRI, EEG, echocardiogram and has been to the emergency department where she was monitored.  She has not had a cardiac  monitor placed so I think that is our neck step in working this up further.   Seizure-like activity.  I do feel that these are probably related.  There is a little unusual and they are definitely distinct events.  I do think she should let the neurologist know even though she did have a normal EEG that she has had another 1 of these episodes.  Again we will start with a 30-day cardiac monitor.   No orders of the defined types were placed in this encounter.   Follow-up: No follow-ups on file.    Beatrice Lecher, MD

## 2021-02-20 NOTE — Progress Notes (Signed)
Pt reports that about 2 weeks ago while sleeping she had another seizure. She said that she began to shake and her breathing became labored so her husband turned her over to her side until this subsided. She never awakened she said that she could feel her self becoming dizzy during this time

## 2021-02-26 ENCOUNTER — Ambulatory Visit (INDEPENDENT_AMBULATORY_CARE_PROVIDER_SITE_OTHER): Payer: Medicare Other

## 2021-02-26 DIAGNOSIS — R55 Syncope and collapse: Secondary | ICD-10-CM | POA: Diagnosis not present

## 2021-02-26 DIAGNOSIS — R569 Unspecified convulsions: Secondary | ICD-10-CM

## 2021-04-08 ENCOUNTER — Ambulatory Visit (INDEPENDENT_AMBULATORY_CARE_PROVIDER_SITE_OTHER): Payer: Medicare Other | Admitting: Physician Assistant

## 2021-04-08 ENCOUNTER — Encounter: Payer: Self-pay | Admitting: Physician Assistant

## 2021-04-08 ENCOUNTER — Encounter: Payer: Self-pay | Admitting: Family Medicine

## 2021-04-08 ENCOUNTER — Other Ambulatory Visit: Payer: Self-pay

## 2021-04-08 VITALS — BP 151/74 | HR 90 | Ht 62.0 in | Wt 163.0 lb

## 2021-04-08 DIAGNOSIS — G4489 Other headache syndrome: Secondary | ICD-10-CM | POA: Diagnosis not present

## 2021-04-08 DIAGNOSIS — R5383 Other fatigue: Secondary | ICD-10-CM | POA: Diagnosis not present

## 2021-04-08 DIAGNOSIS — R569 Unspecified convulsions: Secondary | ICD-10-CM | POA: Diagnosis not present

## 2021-04-08 DIAGNOSIS — M791 Myalgia, unspecified site: Secondary | ICD-10-CM | POA: Insufficient documentation

## 2021-04-08 NOTE — Progress Notes (Signed)
Subjective:    Patient ID: Sherri Allison, female    DOB: October 04, 1941, 80 y.o.   MRN: [id number]  HPI Pt is a 80 yo female who presents to the clinic with her husband to discuss 2 seizures that occurred last night without patient aware but husband witnessed. They occurred at 1:30am and 3:00 am and last about 1 minute of jerking and then 5 minutes of moaning. Pt woke up this morning with headache, weakness, muscle cramps/pain but no memory of event. She did not urinate on herself.   Her first seizure in the middle of the night was October. She saw Dr. Lynnette Caffey in January. EEG and MRI and Echo and heart monitor normal.   She continues to have dizziness and near syncope during the day.   She denies any medication changes. She did not do anything out of ordinary yesterday. She tested herself for covid and was negative. No recent head trauma. No URI symptoms. No urinary symptoms. No cough.   .. Active Ambulatory Problems    Diagnosis Date Noted   UNSPECIFIED VITAMIN D DEFICIENCY 07/23/2010   Hyperlipidemia 08/01/2010   OVERACTIVE BLADDER 11/01/2007   UTERINE PROLAPSE 11/23/2007   SCIATICA 05/11/2009   BACK PAIN, LUMBAR, WITH RADICULOPATHY 02/14/2008   Osteoporosis 11/02/2007   CKD (chronic kidney disease) stage 3, GFR 30-59 ml/min (HCC) 01/16/2016   Combined forms of age-related cataract of right eye 11/16/2017   History of stroke 06/20/2020   Episodic lightheadedness 06/20/2020   Thoracic aortic aneurysm ([place]) 07/16/2020   Grade I diastolic dysfunction 53/29/9242   Elevated CK 08/22/2020   Family history of malignant neoplasm of pancreas 08/22/2020   Atypical chest pain 08/22/2020   Seizure ([place]) 08/22/2020   Radiculopathy of cervicothoracic region 12/10/2020   Resolved Ambulatory Problems    Diagnosis Date Noted   No Resolved Ambulatory Problems   Past Medical History:  Diagnosis Date   Fibroids    Macular degeneration    Normal cardiac stress test      Review of  Systems See HPI>     Objective:   Physical Exam Vitals reviewed.  Constitutional:      Appearance: Normal appearance.  HENT:     Head: Normocephalic.  Neck:     Vascular: No carotid bruit.  Cardiovascular:     Rate and Rhythm: Normal rate and regular rhythm.     Pulses: Normal pulses.  Pulmonary:     Effort: Pulmonary effort is normal.     Breath sounds: Normal breath sounds.  Musculoskeletal:     Right lower leg: No edema.     Left lower leg: No edema.  Neurological:     General: No focal deficit present.     Mental Status: She is alert and oriented to person, place, and time.  Psychiatric:        Mood and Affect: Mood normal.          Assessment & Plan:  .Marland KitchenHugh was seen today for seizures.  Diagnoses and all orders for this visit:  Seizure ([place]) -     BASIC METABOLIC PANEL WITH GFR -     Magnesium -     CK -     TSH -     CBC with Differential/Platelet  Muscle ache -     BASIC METABOLIC PANEL WITH GFR -     Magnesium -     CK -     TSH -     CBC with Differential/Platelet  Likely  her symptoms this morning are post seizure sequela. Labs ordered today. APPT with Dr. Lynnette Caffey scheduled for Thursday, neurology. Unclear etiology of what is setting these off.  Pt is stable today.  Husband is convinced it was the J and J vaccine.  Follow up with PCP in next 4 weeks.

## 2021-04-08 NOTE — Patient Instructions (Signed)
Appt with Dr. Lynnette Caffey.  Get labs today.

## 2021-04-09 ENCOUNTER — Encounter: Payer: Self-pay | Admitting: Physician Assistant

## 2021-04-09 NOTE — Telephone Encounter (Signed)
Were you able to add the mono onto labs?

## 2021-04-09 NOTE — Progress Notes (Signed)
Deshon,   Your monocytes and WBC are elevated that can mean you are fighting an infection. I would like to test blood for mono if we can add on to blood. How do you feel today? Any other symptoms? Recheck in 1 week.   Your CK levels are elevated but that is indicative that you did indeed have seizures the other night. It is also reason for you to feel very achy today and for a few days. Best treatment for this is lots of hydration.   Your GFR function has dropped some over the last 9 months but it could be more acute from seizure. Recheck in 1 month.   Thyroid is normal.

## 2021-04-10 DIAGNOSIS — I712 Thoracic aortic aneurysm, without rupture: Secondary | ICD-10-CM | POA: Diagnosis not present

## 2021-04-12 LAB — CBC WITH DIFFERENTIAL/PLATELET
Absolute Monocytes: 992 cells/uL — ABNORMAL HIGH (ref 200–950)
Basophils Absolute: 47 cells/uL (ref 0–200)
Basophils Relative: 0.3 %
Eosinophils Absolute: 47 cells/uL (ref 15–500)
Eosinophils Relative: 0.3 %
HCT: 43 % (ref 35.0–45.0)
Hemoglobin: 14.3 g/dL (ref 11.7–15.5)
Lymphs Abs: 2434 cells/uL (ref 850–3900)
MCH: 32.9 pg (ref 27.0–33.0)
MCHC: 33.3 g/dL (ref 32.0–36.0)
MCV: 99.1 fL (ref 80.0–100.0)
MPV: 10.9 fL (ref 7.5–12.5)
Monocytes Relative: 6.4 %
Neutro Abs: 11982 cells/uL — ABNORMAL HIGH (ref 1500–7800)
Neutrophils Relative %: 77.3 %
Platelets: 315 10*3/uL (ref 140–400)
RBC: 4.34 10*6/uL (ref 3.80–5.10)
RDW: 12.1 % (ref 11.0–15.0)
Total Lymphocyte: 15.7 %
WBC: 15.5 10*3/uL — ABNORMAL HIGH (ref 3.8–10.8)

## 2021-04-12 LAB — BASIC METABOLIC PANEL WITH GFR
BUN/Creatinine Ratio: 13 (calc) (ref 6–22)
BUN: 14 mg/dL (ref 7–25)
CO2: 25 mmol/L (ref 20–32)
Calcium: 9.7 mg/dL (ref 8.6–10.4)
Chloride: 105 mmol/L (ref 98–110)
Creat: 1.08 mg/dL — ABNORMAL HIGH (ref 0.60–0.93)
GFR, Est African American: 57 mL/min/{1.73_m2} — ABNORMAL LOW (ref >=60)
GFR, Est Non African American: 49 mL/min/{1.73_m2} — ABNORMAL LOW (ref >=60)
Glucose, Bld: 87 mg/dL (ref 65–99)
Potassium: 4.9 mmol/L (ref 3.5–5.3)
Sodium: 141 mmol/L (ref 135–146)

## 2021-04-12 LAB — CK: Total CK: 378 U/L — ABNORMAL HIGH (ref 29–143)

## 2021-04-12 LAB — EPSTEIN BARR VIRUS DNA, QUANT RTPCR
EBV DNA, QN PCR: NOT DETECTED Log cps/mL
EBV DNA, QN PCR: NOT DETECTED copies/mL

## 2021-04-12 LAB — TSH: TSH: 0.7 mIU/L (ref 0.40–4.50)

## 2021-04-12 LAB — MAGNESIUM: Magnesium: 2.2 mg/dL (ref 1.5–2.5)

## 2021-04-14 NOTE — Progress Notes (Signed)
Sherri Allison,   How are you feeling today? Mono was negative.

## 2021-05-06 ENCOUNTER — Ambulatory Visit: Payer: Medicare Other | Admitting: Family Medicine

## 2021-07-02 ENCOUNTER — Telehealth: Payer: Self-pay | Admitting: Family Medicine

## 2021-07-02 NOTE — Telephone Encounter (Signed)
Spoke with patient to schedule Medicare Annual Wellness Visit (AWV) either virtually or in office.  Pt declined stating she only see pcp    Last AWV 07/04/18  please schedule at anytime with health coach

## 2021-08-21 ENCOUNTER — Other Ambulatory Visit: Payer: Self-pay | Admitting: Family Medicine

## 2022-02-09 DIAGNOSIS — I712 Thoracic aortic aneurysm, without rupture, unspecified: Secondary | ICD-10-CM | POA: Diagnosis not present

## 2022-06-10 ENCOUNTER — Encounter: Payer: Self-pay | Admitting: General Practice

## 2022-06-16 DIAGNOSIS — H353124 Nonexudative age-related macular degeneration, left eye, advanced atrophic with subfoveal involvement: Secondary | ICD-10-CM | POA: Diagnosis not present

## 2022-06-16 DIAGNOSIS — H353213 Exudative age-related macular degeneration, right eye, with inactive scar: Secondary | ICD-10-CM | POA: Diagnosis not present

## 2022-06-16 DIAGNOSIS — H43813 Vitreous degeneration, bilateral: Secondary | ICD-10-CM | POA: Diagnosis not present

## 2022-06-16 DIAGNOSIS — H2511 Age-related nuclear cataract, right eye: Secondary | ICD-10-CM | POA: Diagnosis not present

## 2022-06-17 ENCOUNTER — Ambulatory Visit: Payer: Self-pay

## 2022-06-17 NOTE — Patient Outreach (Signed)
  Care Coordination   Initial Visit Note   06/17/2022 Name: Sherri Allison MRN: [id number] DOB: [birth year]  Sherri Allison is a 81 y.o. year old female who sees Metheney, Rene Kocher, MD for primary care. I spoke with  Sherri Allison by phone today.  What matters to the patients health and wellness today?  States, "we are alright". Declines care coordination services at this time.    Goals Addressed             This Visit's Progress    COMPLETED: Care Coordination Activities-no follow up required       Care Coordination Interventions: Discussed care coordination services Encouraged to contact and provided contact information if care coordination needs change         SDOH assessments and interventions completed:  No     Care Coordination Interventions Activated:  Yes  Care Coordination Interventions:  Yes, provided   Follow up plan: No further intervention required.   Encounter Outcome:  Pt. Refused

## 2022-06-17 NOTE — Patient Instructions (Signed)
Visit Information  Thank you for allowing me to share the care management and care coordination services that are available to you as part of your health plan and services through your primary care provider and medical home. Please reach out to me at [phone] or your primary care provider if the care management/care coordination team may be of assistance to you in the future.   Thea Silversmith, RN, MSN, BSN, CCM Care Coordinator [phone]

## 2022-09-07 ENCOUNTER — Telehealth: Payer: Self-pay | Admitting: General Practice

## 2022-09-07 NOTE — Telephone Encounter (Signed)
Attempted to call patient to schedule medicare wellness visit. The number in her chart is invalid.

## 2023-08-04 ENCOUNTER — Ambulatory Visit (INDEPENDENT_AMBULATORY_CARE_PROVIDER_SITE_OTHER): Payer: Medicare Other | Admitting: Family Medicine

## 2023-08-04 ENCOUNTER — Encounter: Payer: Self-pay | Admitting: Family Medicine

## 2023-08-04 VITALS — BP 126/64 | HR 79 | Ht 62.0 in | Wt 161.0 lb

## 2023-08-04 DIAGNOSIS — N1831 Chronic kidney disease, stage 3a: Secondary | ICD-10-CM | POA: Diagnosis not present

## 2023-08-04 DIAGNOSIS — H547 Unspecified visual loss: Secondary | ICD-10-CM | POA: Diagnosis not present

## 2023-08-04 DIAGNOSIS — Z0000 Encounter for general adult medical examination without abnormal findings: Secondary | ICD-10-CM | POA: Diagnosis not present

## 2023-08-04 DIAGNOSIS — M810 Age-related osteoporosis without current pathological fracture: Secondary | ICD-10-CM

## 2023-08-04 DIAGNOSIS — R569 Unspecified convulsions: Secondary | ICD-10-CM

## 2023-08-04 DIAGNOSIS — I7121 Aneurysm of the ascending aorta, without rupture: Secondary | ICD-10-CM

## 2023-08-04 MED ORDER — ATORVASTATIN CALCIUM 20 MG PO TABS: ORAL_TABLET | ORAL | 3 refills | Status: DC

## 2023-08-04 MED ORDER — ALENDRONATE SODIUM 70 MG PO TABS: 70.0000 mg | ORAL_TABLET | ORAL | 4 refills | Status: DC

## 2023-08-04 NOTE — Progress Notes (Signed)
Complete physical exam  Patient: Sherri Allison   DOB: August 19, 1941   82 y.o. Female  MRN: [id number]  Subjective:    Chief Complaint  Patient presents with   Annual Exam    Sherri Allison is a 82 y.o. female who presents today for a complete physical exam. She reports consuming a general diet. The patient does not participate in regular exercise at present. She generally feels well. She reports sleeping fairly well. She does not have additional problems to discuss today.    Most recent fall risk assessment:    08/04/2023   10:58 AM  Fall Risk   Falls in the past year? 0  Number falls in past yr: 0  Injury with Fall? 0  Risk for fall due to : No Fall Risks  Follow up Falls evaluation completed     Most recent depression screenings:    08/04/2023   10:59 AM 06/20/2020   10:00 AM  PHQ 2/9 Scores  PHQ - 2 Score 0 0      Social History   Tobacco Use   Smoking status: Never   Smokeless tobacco: Never  Vaping Use   Vaping status: Never Used  Substance Use Topics   Alcohol use: No   Drug use: No      Patient Care Team: Agapito Games, MD as PCP - General (Family Medicine)   Outpatient Medications Prior to Visit  Medication Sig   AMBULATORY NON FORMULARY MEDICATION Take 10 drops by mouth daily. Medication Name: INFLUENZINUM (HOMEOPATHIC). 10 drops daily with water for 10 months thru the FLU season.   aspirin EC 81 MG tablet Take 81 mg by mouth daily. Swallow whole.   cholecalciferol (VITAMIN D) 1000 UNITS tablet Take 2,000 Units by mouth daily.    levETIRAcetam (KEPPRA) 500 MG tablet Take 500 mg by mouth daily.   MULTIPLE VITAMINS PO Take 1 capsule by mouth daily.   Multiple Vitamins-Minerals (PRESERVISION AREDS 2) CAPS Take 2 capsules by mouth daily.   [DISCONTINUED] alendronate (FOSAMAX) 70 MG tablet Take 1 tablet (70 mg total) by mouth every 7 (seven) days. Take with a full glass of water on an empty stomach.   [DISCONTINUED] atorvastatin (LIPITOR) 20 MG  tablet TAKE 1 TABLET(20 MG) BY MOUTH AT BEDTIME   No facility-administered medications prior to visit.    ROS        Objective:     BP 134/62   Pulse 79   Ht 5\' 2"  (1.575 m)   Wt 161 lb (73 kg)   SpO2 97%   BMI 29.45 kg/m  BP Readings from Last 3 Encounters:  08/04/23 134/62  04/08/21 (!) 151/74  02/20/21 138/72      Physical Exam Constitutional:      Appearance: Normal appearance.  HENT:     Head: Normocephalic and atraumatic.     Right Ear: Tympanic membrane, ear canal and external ear normal.     Left Ear: Tympanic membrane, ear canal and external ear normal.     Nose: Nose normal.     Mouth/Throat:     Pharynx: Oropharynx is clear.  Eyes:     Extraocular Movements: Extraocular movements intact.     Conjunctiva/sclera: Conjunctivae normal.     Pupils: Pupils are equal, round, and reactive to light.  Neck:     Thyroid: No thyromegaly.  Cardiovascular:     Rate and Rhythm: Normal rate and regular rhythm.  Pulmonary:     Effort: Pulmonary effort is  normal.     Breath sounds: Normal breath sounds.  Musculoskeletal:        General: No swelling.     Cervical back: Neck supple.  Skin:    General: Skin is warm and dry.  Neurological:     Mental Status: She is oriented to person, place, and time.  Psychiatric:        Mood and Affect: Mood normal.        Behavior: Behavior normal.      No results found for any visits on 08/04/23.      Assessment & Plan:    Routine Health Maintenance and Physical Exam  Immunization History  Administered Date(s) Administered   Fluad Quad(high Dose 65+) 07/16/2020   Influenza, High Dose Seasonal PF 07/06/2018   Janssen (J&J) SARS-COV-2 Vaccination 01/29/2020   Pneumococcal Conjugate-13 06/26/2015   Pneumococcal Polysaccharide-23 11/23/2007   Td 11/23/2007   Tdap 07/06/2018   Zoster, Live 07/23/2010    Health Maintenance  Topic Date Due   Medicare Annual Wellness (AWV)  07/05/2019   Zoster Vaccines- Shingrix (1  of 2) 11/04/2023 (Originally 06/19/1992)   INFLUENZA VACCINE  01/17/2024 (Originally 05/20/2023)   COVID-19 Vaccine (2 - 2023-24 season) 08/19/2024 (Originally 06/20/2023)   DTaP/Tdap/Td (3 - Td or Tdap) 07/06/2028   Pneumonia Vaccine 53+ Years old  Completed   DEXA SCAN  Completed   HPV VACCINES  Aged Out    Discussed health benefits of physical activity, and encouraged her to engage in regular exercise appropriate for her age and condition.  Problem List Items Addressed This Visit   None Visit Diagnoses     Wellness examination    -  Primary       Keep up a regular exercise program and make sure you are eating a healthy diet Try to eat 4 servings of dairy a day, or if you are lactose intolerant take a calcium with vitamin D daily.  Your vaccines are up to date.   No follow-ups on file.     Nani Gasser, MD

## 2023-08-04 NOTE — Assessment & Plan Note (Signed)
Had a significant decline in her vision over the last 2 years.  She no longer drives and has difficulty reading normal-sized text.

## 2023-08-04 NOTE — Assessment & Plan Note (Signed)
To repeat DEXA.  She can take a drug holiday from the Fosamax after 5 years if her bone density is stable.  She has been off the medication for about a year.

## 2023-08-04 NOTE — Progress Notes (Signed)
Annual Wellness Visit     Patient: Sherri Allison, Female    DOB: 1940/12/07, 82 y.o.   MRN: [id number]  Subjective  Chief Complaint  Patient presents with   Annual Exam    Sherri Allison is a 82 y.o. female who presents today for her Annual Wellness Visit.   HPI       Medications: Outpatient Medications Prior to Visit  Medication Sig   AMBULATORY NON FORMULARY MEDICATION Take 10 drops by mouth daily. Medication Name: INFLUENZINUM (HOMEOPATHIC). 10 drops daily with water for 10 months thru the FLU season.   aspirin EC 81 MG tablet Take 81 mg by mouth daily. Swallow whole.   cholecalciferol (VITAMIN D) 1000 UNITS tablet Take 2,000 Units by mouth daily.    levETIRAcetam (KEPPRA) 500 MG tablet Take 500 mg by mouth daily.   MULTIPLE VITAMINS PO Take 1 capsule by mouth daily.   Multiple Vitamins-Minerals (PRESERVISION AREDS 2) CAPS Take 2 capsules by mouth daily.   [DISCONTINUED] alendronate (FOSAMAX) 70 MG tablet Take 1 tablet (70 mg total) by mouth every 7 (seven) days. Take with a full glass of water on an empty stomach.   [DISCONTINUED] atorvastatin (LIPITOR) 20 MG tablet TAKE 1 TABLET(20 MG) BY MOUTH AT BEDTIME   No facility-administered medications prior to visit.    No Known Allergies  Patient Care Team: Agapito Games, MD as PCP - General (Family Medicine)  ROS      Objective  BP 126/64   Pulse 79   Ht 5\' 2"  (1.575 m)   Wt 161 lb (73 kg)   SpO2 97%   BMI 29.45 kg/m    Physical Exam    Most recent functional status assessment:    08/04/2023   11:43 AM  In your present state of health, do you have any difficulty performing the following activities:  Hearing? 0  Vision? 1  Comment patient has AMD  Difficulty concentrating or making decisions? 0  Walking or climbing stairs? 0  Dressing or bathing? 0  Doing errands, shopping? 1  Comment She has limited vision so she is reliant on husband or others to assist her with tasks.   Most recent  fall risk assessment:    08/04/2023   10:58 AM  Fall Risk   Falls in the past year? 0  Number falls in past yr: 0  Injury with Fall? 0  Risk for fall due to : No Fall Risks  Follow up Falls evaluation completed    Most recent depression screenings:    08/04/2023   10:59 AM 06/20/2020   10:00 AM  PHQ 2/9 Scores  PHQ - 2 Score 0 0   Most recent cognitive screening:    07/04/2018    8:43 AM  6CIT Screen  What Year? 0 points  What month? 0 points  What time? 0 points  Count back from 20 0 points  Months in reverse 0 points  Repeat phrase 0 points  Total Score 0 points   Most recent Audit-C alcohol use screening    08/02/2023    2:45 PM  Alcohol Use Disorder Test (AUDIT)  1. How often do you have a drink containing alcohol? 2  2. How many drinks containing alcohol do you have on a typical day when you are drinking? 0  3. How often do you have six or more drinks on one occasion? 0  AUDIT-C Score 2   A score of 3 or more in women, and 4  or more in men indicates increased risk for alcohol abuse, EXCEPT if all of the points are from question 1   Vision/Hearing Screen: No results found.    No results found for any visits on 08/04/23.    Assessment & Plan   Annual wellness visit done today including the all of the following: Reviewed patient's Family Medical History Reviewed and updated list of patient's medical providers Assessment of cognitive impairment was done Assessed patient's functional ability Established a written schedule for health screening services Health Risk Assessent Completed and Reviewed  Exercise Activities and Dietary recommendations  Goals      Patient Stated     Get tetanus and shingles vaccines up dated.         Immunization History  Administered Date(s) Administered   Fluad Quad(high Dose 65+) 07/16/2020   Influenza, High Dose Seasonal PF 07/06/2018   Janssen (J&J) SARS-COV-2 Vaccination 01/29/2020   Pneumococcal Conjugate-13  06/26/2015   Pneumococcal Polysaccharide-23 11/23/2007   Td 11/23/2007   Tdap 07/06/2018   Zoster, Live 07/23/2010    Health Maintenance  Topic Date Due   Zoster Vaccines- Shingrix (1 of 2) 11/04/2023 (Originally 06/19/1992)   INFLUENZA VACCINE  01/17/2024 (Originally 05/20/2023)   COVID-19 Vaccine (2 - 2023-24 season) 08/19/2024 (Originally 06/20/2023)   Medicare Annual Wellness (AWV)  08/03/2024   DTaP/Tdap/Td (3 - Td or Tdap) 07/06/2028   Pneumonia Vaccine 9+ Years old  Completed   DEXA SCAN  Completed   HPV VACCINES  Aged Out     Discussed health benefits of physical activity, and encouraged her to engage in regular exercise appropriate for her age and condition.    Problem List Items Addressed This Visit       Cardiovascular and Mediastinum   Thoracic aortic aneurysm [facility])    Due for repeat echo.        Relevant Medications   atorvastatin (LIPITOR) 20 MG tablet   Other Relevant Orders   ECHOCARDIOGRAM COMPLETE   CMP14+EGFR   Lipid panel   CBC   VITAMIN D 25 Hydroxy (Vit-D Deficiency, Fractures)   Urine Microalbumin w/creat. ratio     Musculoskeletal and Integument   Osteoporosis    To repeat DEXA.  She can take a drug holiday from the Fosamax after 5 years if her bone density is stable.  She has been off the medication for about a year.      Relevant Medications   alendronate (FOSAMAX) 70 MG tablet   Other Relevant Orders   CMP14+EGFR   Lipid panel   CBC   VITAMIN D 25 Hydroxy (Vit-D Deficiency, Fractures)   Urine Microalbumin w/creat. ratio   DG Bone Density     Genitourinary   CKD (chronic kidney disease) stage 3, GFR 30-59 ml/min (HCC)   Relevant Orders   CMP14+EGFR   Lipid panel   CBC   VITAMIN D 25 Hydroxy (Vit-D Deficiency, Fractures)   Urine Microalbumin w/creat. ratio     Other   Vision impairment    Had a significant decline in her vision over the last 2 years.  She no longer drives and has difficulty reading normal-sized text.       Seizure (HCC)    Only on Keppra and has been stable for quite some time.      Relevant Medications   levETIRAcetam (KEPPRA) 500 MG tablet   Other Visit Diagnoses     Wellness examination    -  Primary   Medicare annual wellness visit, subsequent  Return in about 6 months (around 02/02/2024).     Nani Gasser, MD

## 2023-08-04 NOTE — Assessment & Plan Note (Signed)
Due for repeat echo.

## 2023-08-04 NOTE — Assessment & Plan Note (Signed)
Only on Keppra and has been stable for quite some time.

## 2023-08-05 LAB — CBC
Hematocrit: 41.7 % (ref 34.0–46.6)
Hemoglobin: 13.8 g/dL (ref 11.1–15.9)
MCH: 32.9 pg (ref 26.6–33.0)
MCHC: 33.1 g/dL (ref 31.5–35.7)
MCV: 99 fL — ABNORMAL HIGH (ref 79–97)
Platelets: 328 10*3/uL (ref 150–450)
RBC: 4.2 x10E6/uL (ref 3.77–5.28)
RDW: 12.1 % (ref 11.7–15.4)
WBC: 9.8 10*3/uL (ref 3.4–10.8)

## 2023-08-05 LAB — MICROALBUMIN / CREATININE URINE RATIO
Creatinine, Urine: 126.3 mg/dL
Microalb/Creat Ratio: 24 mg/g{creat} (ref 0–29)
Microalbumin, Urine: 30.1 ug/mL

## 2023-08-05 LAB — CMP14+EGFR
ALT: 6 [IU]/L (ref 0–32)
AST: 16 [IU]/L (ref 0–40)
Albumin: 4.7 g/dL (ref 3.7–4.7)
Alkaline Phosphatase: 67 [IU]/L (ref 44–121)
BUN/Creatinine Ratio: 14 (ref 12–28)
BUN: 15 mg/dL (ref 8–27)
Bilirubin Total: 1.5 mg/dL — ABNORMAL HIGH (ref 0.0–1.2)
CO2: 23 mmol/L (ref 20–29)
Calcium: 9.2 mg/dL (ref 8.7–10.3)
Chloride: 104 mmol/L (ref 96–106)
Creatinine, Ser: 1.07 mg/dL — ABNORMAL HIGH (ref 0.57–1.00)
Globulin, Total: 2.1 g/dL (ref 1.5–4.5)
Glucose: 82 mg/dL (ref 70–99)
Potassium: 4.5 mmol/L (ref 3.5–5.2)
Sodium: 142 mmol/L (ref 134–144)
Total Protein: 6.8 g/dL (ref 6.0–8.5)
eGFR: 52 mL/min/{1.73_m2} — ABNORMAL LOW (ref >59)

## 2023-08-05 LAB — LIPID PANEL
Chol/HDL Ratio: 3.9 {ratio} (ref 0.0–4.4)
Cholesterol, Total: 223 mg/dL — ABNORMAL HIGH (ref 100–199)
HDL: 57 mg/dL (ref >39)
LDL Chol Calc (NIH): 144 mg/dL — ABNORMAL HIGH (ref 0–99)
Triglycerides: 122 mg/dL (ref 0–149)
VLDL Cholesterol Cal: 22 mg/dL (ref 5–40)

## 2023-08-05 LAB — VITAMIN D 25 HYDROXY (VIT D DEFICIENCY, FRACTURES): Vit D, 25-Hydroxy: 33.3 ng/mL (ref 30.0–100.0)

## 2023-08-05 NOTE — Progress Notes (Signed)
Hi Sherri Allison, total cholesterol and LDL are mildly elevated.  Are you taking your atorvastatin every night?  Or are you taking it differently?  If you are taking it nightly then we may need to adjust your dose to better control your cholesterol numbers.  So, continue work on healthy diet and regular exercise.  Kidney function is stable.  Liver enzymes look good.  Blood count is normal.  Vitamin D is still on the low end.  I would recommend taking 1000 IU / 25 mcg daily.  This should be pretty easy to find over-the-counter.  Urine test is still pending.

## 2023-08-14 ENCOUNTER — Encounter: Payer: Self-pay | Admitting: Family Medicine

## 2023-08-18 ENCOUNTER — Ambulatory Visit: Payer: Medicare Other

## 2023-08-18 ENCOUNTER — Encounter: Payer: Self-pay | Admitting: Family Medicine

## 2023-08-18 DIAGNOSIS — M810 Age-related osteoporosis without current pathological fracture: Secondary | ICD-10-CM

## 2023-08-18 NOTE — Progress Notes (Signed)
HI Sherri Allison,  Your bone density shows a T-score of -2.6 which is consistent with osteoporosis.  No significant change from prior bone density in 2022 which is great at least at stable.  Continue with your current regimen   The current recommendation for osteoporosis treatment includes:   #1 calcium-total of 1200 mg of calcium daily.  If you eat a very calcium rich diet you may be able to obtain that without a supplement.  If not, then I recommend calcium 500 mg twice a day.  There are several products over-the-counter such as Caltrate D and Viactiv chews which are great options that contain calcium and vitamin D. #2 vitamin D-recommend 800 international units daily. #3 exercise-recommend 30 minutes of weightbearing exercise 3 days a week.  Resistance training ,such as doing bands and light weights, can be particularly helpful. #4 medication-continue your alendronate

## 2023-09-07 ENCOUNTER — Ambulatory Visit (HOSPITAL_BASED_OUTPATIENT_CLINIC_OR_DEPARTMENT_OTHER)
Admission: RE | Admit: 2023-09-07 | Discharge: 2023-09-07 | Disposition: A | Discharge status: Home / Self Care | Payer: Medicare Other | Source: Ambulatory Visit | Attending: Family Medicine | Admitting: Family Medicine

## 2023-09-07 DIAGNOSIS — I7121 Aneurysm of the ascending aorta, without rupture: Secondary | ICD-10-CM | POA: Insufficient documentation

## 2023-09-08 LAB — ECHOCARDIOGRAM COMPLETE
AR max vel: 2.54 cm2
AV Area VTI: 2.46 cm2
AV Area mean vel: 2.63 cm2
AV Mean grad: 3 mm[Hg]
AV Peak grad: 5.2 mm[Hg]
AV Vena cont: 0.2 cm
Ao pk vel: 1.14 m/s
Area-P 1/2: 3.28 cm2
Calc EF: 66.7 %
MV M vel: 4.9 m/s
MV Peak grad: 96 mm[Hg]
P 1/2 time: 651 ms
S' Lateral: 2.9 cm
Single Plane A2C EF: 72.1 %
Single Plane A4C EF: 60.8 %

## 2023-09-08 NOTE — Progress Notes (Signed)
Hi Sherri Allison, your pumping function of the heart looks great.  The walls are moving normally so no sign of any damage.  The mitral valve and aortic valve look normal.  There is just a little bit of backflow on the aortic valve but nothing concerning at this point in time.  They did note a little bit of widening of the a ascending aorta called an aneurysm.  It is just measuring about 39 mm.  Overall looks pretty stable since 2021 which is really reassuring.

## 2024-02-01 ENCOUNTER — Encounter: Payer: Self-pay | Admitting: Family Medicine

## 2024-02-02 ENCOUNTER — Ambulatory Visit (INDEPENDENT_AMBULATORY_CARE_PROVIDER_SITE_OTHER): Payer: Medicare Other | Admitting: Family Medicine

## 2024-02-02 ENCOUNTER — Encounter: Payer: Self-pay | Admitting: Family Medicine

## 2024-02-02 VITALS — BP 120/74 | HR 65 | Ht 62.0 in | Wt 161.0 lb

## 2024-02-02 DIAGNOSIS — M810 Age-related osteoporosis without current pathological fracture: Secondary | ICD-10-CM

## 2024-02-02 DIAGNOSIS — N1831 Chronic kidney disease, stage 3a: Secondary | ICD-10-CM

## 2024-02-02 DIAGNOSIS — H93A1 Pulsatile tinnitus, right ear: Secondary | ICD-10-CM | POA: Diagnosis not present

## 2024-02-02 DIAGNOSIS — E559 Vitamin D deficiency, unspecified: Secondary | ICD-10-CM

## 2024-02-02 NOTE — Assessment & Plan Note (Signed)
 To recheck vitamin D to make sure her levels are adequate especially with her osteoporosis.  She does take a multivitamin.

## 2024-02-02 NOTE — Patient Instructions (Addendum)
 Okay to stop your Fosamax for now and take a drug holiday.  Discussed with Dr. Cipriano Creeks the pulsatile tinnitus that you are experiencing.  I do not hear a carotid bruit and I would recommend that we get further workup with CTA or MRA for ruling out aneurysm etc.  I am happy to order but just wanted get his opinion if he thinks it might be caused by something else then we can hold off.  Or if he would like to order imaging that would be wonderful to.

## 2024-02-02 NOTE — Assessment & Plan Note (Signed)
 Repeat renal function today will check for proteinuria.  Will call with results once available blood pressure looks fantastic today's important to continue to keep that well-controlled.

## 2024-02-02 NOTE — Progress Notes (Signed)
 Acute Office Visit  Subjective:     Patient ID: Sherri Allison, female    DOB: 05/19/1941, 83 y.o.   MRN: [id number]  Chief Complaint  Patient presents with   Chronic Kidney Disease   Hyperlipidemia    HPI Patient is in today for right ear crackling and throbbing, no pain or drainage from the ear..  Symptoms present x 6 months, initially it was intermittent.  She says the noise has become persistent and not just intermittent anymore.  She said she tried a nasal antihistamine.  The only thing that seems to give her slight relief is if she puts a cottonball in her ear and then lays on that side she can usually get it to go away at night it is more bothersome at night because she is hearing it.  She does feel like her hearing is a little bit more muffled in that ear than it used to be.   She has an appoint with Dr. Langston Masker tomorrow, neurology, to follow-up on her seizure disorder.           ROS      Objective:    BP 120/74 (BP Location: Left Arm, Cuff Size: Normal)   Pulse 65   Ht 5\' 2"  (1.575 m)   Wt 161 lb (73 kg)   SpO2 97%   BMI 29.45 kg/m    Physical Exam Constitutional:      Appearance: Normal appearance.  HENT:     Head: Normocephalic and atraumatic.     Right Ear: Tympanic membrane, ear canal and external ear normal.     Left Ear: Tympanic membrane, ear canal and external ear normal.  Neck:     Vascular: No carotid bruit.  Cardiovascular:     Rate and Rhythm: Normal rate and regular rhythm.  Pulmonary:     Effort: Pulmonary effort is normal.     Breath sounds: Normal breath sounds.  Musculoskeletal:     Cervical back: Neck supple.  Neurological:     Mental Status: She is oriented to person, place, and time.     No results found for any visits on 02/02/24.      Assessment & Plan:   Problem List Items Addressed This Visit       Musculoskeletal and Integument   Osteoporosis   We discussed today that her last DEXA looked great and was stable over  the last 3 years she has been on her bisphosphonate for at least 5 so we discussed taking a drug holiday and just continuing to maintain her adequate calcium, vitamin D and resistance training exercises.  Will still plan to recheck DEXA again in October 2026      Relevant Orders   Urine Microalbumin w/creat. ratio   VITAMIN D 25 Hydroxy (Vit-D Deficiency, Fractures)   Basic Metabolic Panel (BMET)     Genitourinary   CKD (chronic kidney disease) stage 3, GFR 30-59 ml/min (HCC) - Primary   Repeat renal function today will check for proteinuria.  Will call with results once available blood pressure looks fantastic today's important to continue to keep that well-controlled.      Relevant Orders   Urine Microalbumin w/creat. ratio   VITAMIN D 25 Hydroxy (Vit-D Deficiency, Fractures)   Basic Metabolic Panel (BMET)     Other   Vitamin D deficiency   To recheck vitamin D to make sure her levels are adequate especially with her osteoporosis.  She does take a multivitamin.  Relevant Orders   Urine Microalbumin w/creat. ratio   VITAMIN D 25 Hydroxy (Vit-D Deficiency, Fractures)   Basic Metabolic Panel (BMET)   Other Visit Diagnoses       Pulsatile tinnitus of right ear          Pulsatile tinnitus of the right ear since it is becoming more persistent and it has been going on for several months.  I think evaluating for aneurysm would be absolutely reasonable if she already has a known history of thoracic aortic aneurysm.  Not hear any carotid bruits on exam or murmurs radiating up the carotid.  No orders of the defined types were placed in this encounter.   Return in about 6 months (around 08/03/2024) for recheck kidneys .  Duaine German, MD

## 2024-02-02 NOTE — Assessment & Plan Note (Signed)
 We discussed today that her last DEXA looked great and was stable over the last 3 years she has been on her bisphosphonate for at least 5 so we discussed taking a drug holiday and just continuing to maintain her adequate calcium, vitamin D and resistance training exercises.  Will still plan to recheck DEXA again in October 2026

## 2024-02-03 ENCOUNTER — Encounter: Payer: Self-pay | Admitting: Family Medicine

## 2024-02-03 DIAGNOSIS — H93A1 Pulsatile tinnitus, right ear: Secondary | ICD-10-CM | POA: Diagnosis not present

## 2024-02-03 DIAGNOSIS — I712 Thoracic aortic aneurysm, without rupture, unspecified: Secondary | ICD-10-CM | POA: Diagnosis not present

## 2024-02-03 LAB — BASIC METABOLIC PANEL WITH GFR
BUN/Creatinine Ratio: 16 (ref 12–28)
BUN: 16 mg/dL (ref 8–27)
CO2: 22 mmol/L (ref 20–29)
Calcium: 9.4 mg/dL (ref 8.7–10.3)
Chloride: 102 mmol/L (ref 96–106)
Creatinine, Ser: 0.99 mg/dL (ref 0.57–1.00)
Glucose: 87 mg/dL (ref 70–99)
Potassium: 4.7 mmol/L (ref 3.5–5.2)
Sodium: 141 mmol/L (ref 134–144)
eGFR: 57 mL/min/{1.73_m2} — ABNORMAL LOW (ref >59)

## 2024-02-03 LAB — MICROALBUMIN / CREATININE URINE RATIO
Creatinine, Urine: 93.9 mg/dL
Microalb/Creat Ratio: 6 mg/g{creat} (ref 0–29)
Microalbumin, Urine: 6.1 ug/mL

## 2024-02-03 LAB — VITAMIN D 25 HYDROXY (VIT D DEFICIENCY, FRACTURES): Vit D, 25-Hydroxy: 32.1 ng/mL (ref 30.0–100.0)

## 2024-02-03 NOTE — Progress Notes (Signed)
 Hi Hanadi, no excess protein in the urine which is good.

## 2024-02-03 NOTE — Progress Notes (Signed)
 HI Sherri Allison, your metabolic panel looks great.  Vitamin D is on the low end of normal so make sure you are taking 25 mcg daily to improve bone health.  Urine sample still pending.

## 2024-02-16 DIAGNOSIS — I712 Thoracic aortic aneurysm, without rupture, unspecified: Secondary | ICD-10-CM | POA: Diagnosis not present

## 2024-02-16 DIAGNOSIS — H93A1 Pulsatile tinnitus, right ear: Secondary | ICD-10-CM | POA: Diagnosis not present

## 2024-07-27 ENCOUNTER — Encounter: Payer: Self-pay | Admitting: Family Medicine

## 2024-08-03 ENCOUNTER — Encounter: Payer: Self-pay | Admitting: Family Medicine

## 2024-08-03 ENCOUNTER — Ambulatory Visit: Admitting: Family Medicine

## 2024-08-03 VITALS — BP 127/55 | HR 80 | Ht 62.0 in | Wt 161.0 lb

## 2024-08-03 DIAGNOSIS — R569 Unspecified convulsions: Secondary | ICD-10-CM | POA: Diagnosis not present

## 2024-08-03 DIAGNOSIS — K7689 Other specified diseases of liver: Secondary | ICD-10-CM

## 2024-08-03 DIAGNOSIS — M810 Age-related osteoporosis without current pathological fracture: Secondary | ICD-10-CM

## 2024-08-03 DIAGNOSIS — N1831 Chronic kidney disease, stage 3a: Secondary | ICD-10-CM

## 2024-08-03 DIAGNOSIS — E559 Vitamin D deficiency, unspecified: Secondary | ICD-10-CM

## 2024-08-03 NOTE — Assessment & Plan Note (Signed)
 She is pretty consistent with exercise and taking her vitamin D  just encouraged her to make sure she is getting an 800 to 1000 mg of calcium  daily.  Due for repeat DEXA in October 2026.

## 2024-08-03 NOTE — Progress Notes (Signed)
 Established Patient Office Visit  Subjective  Patient ID: Sherri Allison, female    DOB: 1941-03-16  Age: 83 y.o. MRN: [id number]  Chief Complaint  Patient presents with   Medical Management of Chronic Issues    HPI Discussed the use of AI scribe software for clinical note transcription with the patient, who gave verbal consent to proceed.  History of Present Illness Sherri Allison is an 83 year old female who presents for a follow-up on kidney function and general health maintenance.  Renal and urinary symptoms - No dysuria, urinary frequency, or hematuria.  Auditory symptoms - Experienced a clicking sound in the ear, initially disrupted sleep. - CT scan of the head showed no abnormalities. - Clicking resolved spontaneously within one month.  Neuropathic pain - History of severe shingles with persistent pain affecting both sides of the body. - Required injection for pain management.  Seizure disorder - Seizure disorder well-controlled with Keppra. - No recent breakthrough seizures.  Lipid management - Recently refilled cholesterol medication.  Physical activity and functional status - Engages in regular physical activity using a walking elliptical three to four times per week, covering about three miles per session. - Maintains an active lifestyle managing a large household with two staircases.  Nutritional intake and supplementation - Does not take extra calcium  supplements but consumes a healthy diet likely providing sufficient calcium . - Continues to take vitamin D . - No gastrointestinal upset.     ROS    Objective:     BP (!) 127/55   Pulse 80   Ht 5' 2 (1.575 m)   Wt 161 lb (73 kg)   SpO2 96%   BMI 29.45 kg/m    Physical Exam Vitals and nursing note reviewed.  Constitutional:      Appearance: Normal appearance.  HENT:     Head: Normocephalic and atraumatic.  Eyes:     Conjunctiva/sclera: Conjunctivae normal.  Cardiovascular:     Rate and  Rhythm: Normal rate and regular rhythm.  Pulmonary:     Effort: Pulmonary effort is normal.     Breath sounds: Normal breath sounds.  Skin:    General: Skin is warm and dry.  Neurological:     Mental Status: She is alert.  Psychiatric:        Mood and Affect: Mood normal.      No results found for any visits on 08/03/24.    The ASCVD Risk score (Arnett DK, et al., 2019) failed to calculate for the following reasons:   The 2019 ASCVD risk score is only valid for ages 91 to 9    Assessment & Plan:   Problem List Items Addressed This Visit       Digestive   Simple hepatic cyst     Musculoskeletal and Integument   Osteoporosis   She is pretty consistent with exercise and taking her vitamin D  just encouraged her to make sure she is getting an 800 to 1000 mg of calcium  daily.  Due for repeat DEXA in October 2026.      Relevant Orders   Vitamin D  (25 hydroxy)   CMP14+EGFR   Lipid Panel With LDL/HDL Ratio     Genitourinary   CKD stage G3a/A1, GFR 45-59 and albumin creatinine ratio <30 mg/g (HCC) - Primary   CKD stage 3a stable, no new symptoms, normal CT scan. - Order blood test to monitor kidney function.        Other   Vitamin D  deficiency  On supplement.  Will recheck vitamin D  labs.      Relevant Orders   Vitamin D  (25 hydroxy)   CMP14+EGFR   Lipid Panel With LDL/HDL Ratio   Seizure (HCC)   Seizure disorder Seizure disorder controlled with Keppra, no recent seizures.      Assessment and Plan Assessment & Plan    General Health Maintenance Discussed health maintenance, vaccinations, and exercise. She declines flu vaccine, discussed shingles vaccine benefits. - Encourage shingles vaccine, covered by Medicare, administered at pharmacy. - Encourage regular exercise and strength training for bone health. - Ensure adequate calcium  intake, [phone] mg daily. - Continue vitamin D  supplementation.   Return in about 6 months (around 02/01/2025) for Kidney  check up .    Sherri Byars, MD

## 2024-08-03 NOTE — Assessment & Plan Note (Signed)
 CKD stage 3a stable, no new symptoms, normal CT scan. - Order blood test to monitor kidney function.

## 2024-08-03 NOTE — Assessment & Plan Note (Signed)
 Seizure disorder Seizure disorder controlled with Keppra, no recent seizures.

## 2024-08-03 NOTE — Assessment & Plan Note (Signed)
 On supplement.  Will recheck vitamin D  labs.

## 2024-08-03 NOTE — Patient Instructions (Signed)
 Encouraged you to try to get in 800 to 1000 mg of calcium  daily for your bone health.  Some of this can be from your diet.

## 2024-08-04 ENCOUNTER — Ambulatory Visit: Payer: Self-pay | Admitting: Family Medicine

## 2024-08-04 ENCOUNTER — Encounter: Payer: Self-pay | Admitting: Family Medicine

## 2024-08-04 LAB — CMP14+EGFR
ALT: 7 IU/L (ref 0–32)
AST: 17 IU/L (ref 0–40)
Albumin: 4.4 g/dL (ref 3.7–4.7)
Alkaline Phosphatase: 71 IU/L (ref 48–129)
BUN/Creatinine Ratio: 11 — ABNORMAL LOW (ref 12–28)
BUN: 13 mg/dL (ref 8–27)
Bilirubin Total: 1.7 mg/dL — ABNORMAL HIGH (ref 0.0–1.2)
CO2: 23 mmol/L (ref 20–29)
Calcium: 9.5 mg/dL (ref 8.7–10.3)
Chloride: 103 mmol/L (ref 96–106)
Creatinine, Ser: 1.17 mg/dL — ABNORMAL HIGH (ref 0.57–1.00)
Globulin, Total: 2.3 g/dL (ref 1.5–4.5)
Glucose: 88 mg/dL (ref 70–99)
Potassium: 4.3 mmol/L (ref 3.5–5.2)
Sodium: 142 mmol/L (ref 134–144)
Total Protein: 6.7 g/dL (ref 6.0–8.5)
eGFR: 47 mL/min/1.73 — ABNORMAL LOW (ref >59)

## 2024-08-04 LAB — VITAMIN D 25 HYDROXY (VIT D DEFICIENCY, FRACTURES): Vit D, 25-Hydroxy: 29.9 ng/mL — ABNORMAL LOW (ref 30.0–100.0)

## 2024-08-04 LAB — LIPID PANEL WITH LDL/HDL RATIO
Cholesterol, Total: 143 mg/dL (ref 100–199)
HDL: 57 mg/dL (ref >39)
LDL Chol Calc (NIH): 67 mg/dL (ref 0–99)
LDL/HDL Ratio: 1.2 ratio (ref 0.0–3.2)
Triglycerides: 106 mg/dL (ref 0–149)
VLDL Cholesterol Cal: 19 mg/dL (ref 5–40)

## 2024-08-04 NOTE — Progress Notes (Signed)
 Hi Ebany, vitamin D  levels have dropped.  If you are still taking your 1000 units daily then please increase to 2000 units.  Or it may now say 50 mcg on the bottle they as they have changed the numbers on them.  If she is already taking this dose then please let me know.  Kidney function is stable at 1.1.  Bilirubin is slightly elevated but similar to past levels the additional liver enzymes are all normal.  Cholesterol looks great this time.  Better than last year which is wonderful.  I really wanted her to go ahead and schedule her Medicare wellness phone call on the way out yesterday.  If we can get that scheduled that would be wonderful.

## 2024-09-29 ENCOUNTER — Other Ambulatory Visit: Payer: Self-pay | Admitting: Family Medicine

## 2025-02-01 ENCOUNTER — Ambulatory Visit: Admitting: Family Medicine
